# Patient Record
Sex: Male | Born: 1965 | Race: Black or African American | Hispanic: No | Smoking: Current some day smoker
Health system: Southern US, Community
[De-identification: ages and names within clinical notes are randomized; demographics above are authoritative.]

## PROBLEM LIST (undated history)

## (undated) DIAGNOSIS — J449 Chronic obstructive pulmonary disease, unspecified: Secondary | ICD-10-CM

## (undated) DIAGNOSIS — M1990 Unspecified osteoarthritis, unspecified site: Secondary | ICD-10-CM

## (undated) DIAGNOSIS — D171 Benign lipomatous neoplasm of skin and subcutaneous tissue of trunk: Secondary | ICD-10-CM

## (undated) DIAGNOSIS — K429 Umbilical hernia without obstruction or gangrene: Secondary | ICD-10-CM

## (undated) DIAGNOSIS — G4730 Sleep apnea, unspecified: Secondary | ICD-10-CM

## (undated) DIAGNOSIS — K219 Gastro-esophageal reflux disease without esophagitis: Secondary | ICD-10-CM

## (undated) HISTORY — DX: Umbilical hernia without obstruction or gangrene: K42.9

## (undated) HISTORY — DX: Chronic obstructive pulmonary disease, unspecified: J44.9

---

## 1998-05-06 HISTORY — PX: CHEST SURGERY: SHX595

## 2008-08-04 ENCOUNTER — Emergency Department: Payer: Self-pay | Admitting: Emergency Medicine

## 2008-09-12 ENCOUNTER — Emergency Department: Payer: Self-pay | Admitting: Unknown Physician Specialty

## 2008-10-12 ENCOUNTER — Emergency Department: Payer: Self-pay | Admitting: Unknown Physician Specialty

## 2009-03-22 ENCOUNTER — Emergency Department: Payer: Self-pay | Admitting: Emergency Medicine

## 2009-12-18 ENCOUNTER — Emergency Department: Payer: Self-pay | Admitting: Unknown Physician Specialty

## 2010-06-07 ENCOUNTER — Emergency Department: Payer: Self-pay | Admitting: Internal Medicine

## 2013-05-02 ENCOUNTER — Emergency Department: Payer: Self-pay | Admitting: Emergency Medicine

## 2013-05-02 LAB — RAPID INFLUENZA A&B ANTIGENS

## 2013-06-08 ENCOUNTER — Emergency Department: Payer: Self-pay | Admitting: Emergency Medicine

## 2014-12-20 ENCOUNTER — Encounter: Payer: Self-pay | Admitting: Emergency Medicine

## 2014-12-20 DIAGNOSIS — K429 Umbilical hernia without obstruction or gangrene: Secondary | ICD-10-CM | POA: Insufficient documentation

## 2014-12-20 DIAGNOSIS — Z720 Tobacco use: Secondary | ICD-10-CM | POA: Insufficient documentation

## 2014-12-20 LAB — CBC WITH DIFFERENTIAL/PLATELET
BASOS ABS: 0.1 10*3/uL (ref 0–0.1)
Basophils Relative: 1 %
Eosinophils Absolute: 0.3 10*3/uL (ref 0–0.7)
Eosinophils Relative: 4 %
HCT: 44.8 % (ref 40.0–52.0)
Hemoglobin: 15.3 g/dL (ref 13.0–18.0)
LYMPHS PCT: 30 %
Lymphs Abs: 2.4 10*3/uL (ref 1.0–3.6)
MCH: 31 pg (ref 26.0–34.0)
MCHC: 34.2 g/dL (ref 32.0–36.0)
MCV: 90.7 fL (ref 80.0–100.0)
Monocytes Absolute: 0.9 10*3/uL (ref 0.2–1.0)
Monocytes Relative: 11 %
NEUTROS PCT: 54 %
Neutro Abs: 4.4 10*3/uL (ref 1.4–6.5)
Platelets: 197 10*3/uL (ref 150–440)
RBC: 4.94 MIL/uL (ref 4.40–5.90)
RDW: 14 % (ref 11.5–14.5)
WBC: 8.1 10*3/uL (ref 3.8–10.6)

## 2014-12-20 LAB — COMPREHENSIVE METABOLIC PANEL
ALBUMIN: 4.2 g/dL (ref 3.5–5.0)
ALT: 41 U/L (ref 17–63)
AST: 38 U/L (ref 15–41)
Alkaline Phosphatase: 95 U/L (ref 38–126)
Anion gap: 7 (ref 5–15)
BILIRUBIN TOTAL: 0.4 mg/dL (ref 0.3–1.2)
BUN: 18 mg/dL (ref 6–20)
CHLORIDE: 106 mmol/L (ref 101–111)
CO2: 24 mmol/L (ref 22–32)
CREATININE: 1.4 mg/dL — AB (ref 0.61–1.24)
Calcium: 8.8 mg/dL — ABNORMAL LOW (ref 8.9–10.3)
GFR calc Af Amer: >60 mL/min (ref >60)
GFR, EST NON AFRICAN AMERICAN: 58 mL/min — AB (ref >60)
Glucose, Bld: 125 mg/dL — ABNORMAL HIGH (ref 65–99)
Potassium: 3.9 mmol/L (ref 3.5–5.1)
Sodium: 137 mmol/L (ref 135–145)
TOTAL PROTEIN: 7.6 g/dL (ref 6.5–8.1)

## 2014-12-20 LAB — LIPASE, BLOOD: LIPASE: 48 U/L (ref 22–51)

## 2014-12-20 NOTE — ED Notes (Signed)
Pt arrived to the ED for complaints of abdominal pain x2 days with a visible umbilical hernia. Pt states that his last BM was 3 days ago. Pt is AOx4 in moderate pain in triage.

## 2014-12-21 ENCOUNTER — Emergency Department: Payer: Self-pay

## 2014-12-21 ENCOUNTER — Emergency Department
Admission: EM | Admit: 2014-12-21 | Discharge: 2014-12-21 | Disposition: A | Discharge status: Home / Self Care | Payer: Self-pay | Attending: Emergency Medicine | Admitting: Emergency Medicine

## 2014-12-21 DIAGNOSIS — R1033 Periumbilical pain: Secondary | ICD-10-CM

## 2014-12-21 DIAGNOSIS — K429 Umbilical hernia without obstruction or gangrene: Secondary | ICD-10-CM

## 2014-12-21 MED ORDER — IBUPROFEN 800 MG PO TABS: 800.0000 mg | ORAL_TABLET | Freq: Three times a day (TID) | ORAL | Status: DC | PRN

## 2014-12-21 MED ORDER — IOHEXOL 240 MG/ML SOLN
25.0000 mL | Freq: Once | INTRAMUSCULAR | Status: AC | PRN
  Administered 2014-12-21: 25 mL via ORAL

## 2014-12-21 MED ORDER — ACETAMINOPHEN 325 MG PO TABS
650.0000 mg | ORAL_TABLET | Freq: Once | ORAL | Status: AC
  Administered 2014-12-21: 650 mg via ORAL
  Filled 2014-12-21: qty 2

## 2014-12-21 MED ORDER — TRAMADOL HCL 50 MG PO TABS
50.0000 mg | ORAL_TABLET | Freq: Once | ORAL | Status: AC
  Administered 2014-12-21: 50 mg via ORAL
  Filled 2014-12-21: qty 1

## 2014-12-21 MED ORDER — IOHEXOL 300 MG/ML  SOLN
100.0000 mL | Freq: Once | INTRAMUSCULAR | Status: AC | PRN
  Administered 2014-12-21: 100 mL via INTRAVENOUS

## 2014-12-21 MED ORDER — TRAMADOL HCL 50 MG PO TABS: 50.0000 mg | ORAL_TABLET | Freq: Four times a day (QID) | ORAL | Status: DC | PRN

## 2014-12-21 NOTE — Discharge Instructions (Signed)
Abdominal Pain Many things can cause abdominal pain. Usually, abdominal pain is not caused by a disease and will improve without treatment. It can often be observed and treated at home. Your health care provider will do a physical exam and possibly order blood tests and X-rays to help determine the seriousness of your pain. However, in many cases, more time must pass before a clear cause of the pain can be found. Before that point, your health care provider may not know if you need more testing or further treatment. HOME CARE INSTRUCTIONS  Monitor your abdominal pain for any changes. The following actions may help to alleviate any discomfort you are experiencing:  Only take over-the-counter or prescription medicines as directed by your health care provider.  Do not take laxatives unless directed to do so by your health care provider.  Try a clear liquid diet (broth, tea, or water) as directed by your health care provider. Slowly move to a bland diet as tolerated. SEEK MEDICAL CARE IF:  You have unexplained abdominal pain.  You have abdominal pain associated with nausea or diarrhea.  You have pain when you urinate or have a bowel movement.  You experience abdominal pain that wakes you in the night.  You have abdominal pain that is worsened or improved by eating food.  You have abdominal pain that is worsened with eating fatty foods.  You have a fever. SEEK IMMEDIATE MEDICAL CARE IF:   Your pain does not go away within 2 hours.  You keep throwing up (vomiting).  Your pain is felt only in portions of the abdomen, such as the right side or the left lower portion of the abdomen.  You pass bloody or black tarry stools. MAKE SURE YOU:  Understand these instructions.   Will watch your condition.   Will get help right away if you are not doing well or get worse.  Document Released: 01/30/2005 Document Revised: 04/27/2013 Document Reviewed: 12/30/2012 [facility] Patient Information  2015 [place], [place]. This information is not intended to replace advice given to you by your health care provider. Make sure you discuss any questions you have with your health care provider.  Hernia A hernia occurs when an internal organ pushes out through a weak spot in the abdominal wall. Hernias most commonly occur in the groin and around the navel. Hernias often can be pushed back into place (reduced). Most hernias tend to get worse over time. Some abdominal hernias can get stuck in the opening (irreducible or incarcerated hernia) and cannot be reduced. An irreducible abdominal hernia which is tightly squeezed into the opening is at risk for impaired blood supply (strangulated hernia). A strangulated hernia is a medical emergency. Because of the risk for an irreducible or strangulated hernia, surgery may be recommended to repair a hernia. CAUSES   Heavy lifting.  Prolonged coughing.  Straining to have a bowel movement.  A cut (incision) made during an abdominal surgery. HOME CARE INSTRUCTIONS   Bed rest is not required. You may continue your normal activities.  Avoid lifting more than 10 pounds (4.5 kg) or straining.  Cough gently. If you are a smoker it is best to stop. Even the best hernia repair can break down with the continual strain of coughing. Even if you do not have your hernia repaired, a cough will continue to aggravate the problem.  Do not wear anything tight over your hernia. Do not try to keep it in with an outside bandage or truss. These can damage abdominal  contents if they are trapped within the hernia sac.  Eat a normal diet.  Avoid constipation. Straining over long periods of time will increase hernia size and encourage breakdown of repairs. If you cannot do this with diet alone, stool softeners may be used. SEEK IMMEDIATE MEDICAL CARE IF:   You have a fever.  You develop increasing abdominal pain.  You feel nauseous or vomit.  Your hernia is stuck outside  the abdomen, looks discolored, feels hard, or is tender.  You have any changes in your bowel habits or in the hernia that are unusual for you.  You have increased pain or swelling around the hernia.  You cannot push the hernia back in place by applying gentle pressure while lying down. MAKE SURE YOU:   Understand these instructions.  Will watch your condition.  Will get help right away if you are not doing well or get worse. Document Released: 04/22/2005 Document Revised: 07/15/2011 Document Reviewed: 12/10/2007 [facility] Patient Information 2015 [place], [place]. This information is not intended to replace advice given to you by your health care provider. Make sure you discuss any questions you have with your health care provider.

## 2014-12-21 NOTE — ED Provider Notes (Signed)
[facility] Emergency Department Provider Note  ____________________________________________  Time seen: Approximately  157 AM  I have reviewed the triage vital signs and the nursing notes.   HISTORY  Chief Complaint Umbilical Hernia and Abdominal Pain    HPI Barry Raymond is a 49 y.o. male who comes in tonight with abdominal pain. The patient reports that he has an umbilical hernia that he thinks is been there for 6 months and for the last 2 days he has been having some pain around the area. The patient reports he has not been taking anything for the pain he has just been dealing with it. He reports this pain is 8 out of 10 in intensity. The patient does some lifting at work and reports that it bothers him when he lifts things. The patient denies any vomiting or diarrhea. He reports that when he coughs also hernia pokes out. The patient has not had a bowel movement since Saturday. The patient was concerned so he decided to come in for further evaluation.   History reviewed. No pertinent past medical history.  There are no active problems to display for this patient.   History reviewed. No pertinent past surgical history.  Current Outpatient Rx  Name  Route  Sig  Dispense  Refill  . ibuprofen (ADVIL,MOTRIN) 800 MG tablet   Oral   Take 1 tablet (800 mg total) by mouth every 8 (eight) hours as needed.   30 tablet   0   . traMADol (ULTRAM) 50 MG tablet   Oral   Take 1 tablet (50 mg total) by mouth every 6 (six) hours as needed.   12 tablet   0     Allergies Review of patient's allergies indicates no known allergies.  History reviewed. No pertinent family history.  Social History Social History  Substance Use Topics  . Smoking status: Light Tobacco Smoker -- 0.20 packs/day for 20 years    Types: Cigarettes  . Smokeless tobacco: None  . Alcohol Use: Yes    Review of Systems Constitutional: No fever/chills Eyes: No visual changes. ENT: No  sore throat. Cardiovascular: Denies chest pain. Respiratory: Denies shortness of breath. Gastrointestinal:  abdominal pain.  No nausea, no vomiting.  No diarrhea.  No constipation. Genitourinary: Negative for dysuria. Musculoskeletal: Negative for back pain. Skin: Negative for rash. Neurological: Negative for headaches, focal weakness or numbness.  10-point ROS otherwise negative.  ____________________________________________   PHYSICAL EXAM:  VITAL SIGNS: ED Triage Vitals  Enc Vitals Group     BP 12/20/14 2013 113/67 mmHg     Pulse Rate 12/20/14 2013 91     Resp 12/20/14 2013 20     Temp 12/20/14 2013 98.8 F (37.1 C)     Temp Source 12/20/14 2013 Oral     SpO2 12/20/14 2013 93 %     Weight 12/20/14 2013 244 lb (110.678 kg)     Height 12/20/14 2013 5\' 8"  (1.727 m)     Head Cir --      Peak Flow --      Pain Score 12/20/14 2014 9     Pain Loc --      Pain Edu? --      Excl. in [place]? --     Constitutional: Alert and oriented. Well appearing and in mild distress. Eyes: Conjunctivae are normal. PERRL. EOMI. Head: Atraumatic. Nose: No congestion/rhinnorhea. Mouth/Throat: Mucous membranes are moist.  Oropharynx non-erythematous. Cardiovascular: Normal rate, regular rhythm. Grossly normal heart sounds.  Good peripheral  circulation. Respiratory: Normal respiratory effort.  No retractions. Lungs CTAB. Gastrointestinal: Soft with mild perumbilical and umbilical tenderness to palpation. Patient's hernia is reducible.. No distention. Positive bowel sounds Musculoskeletal: No lower extremity tenderness nor edema.  No joint effusions. Neurologic:  Normal speech and language. No gross focal neurologic deficits are appreciated. No gait instability. Skin:  Skin is warm, dry and intact. No rash noted. Psychiatric: Mood and affect are normal.   ____________________________________________   LABS (all labs ordered are listed, but only abnormal results are displayed)  Labs Reviewed   COMPREHENSIVE METABOLIC PANEL - Abnormal; Notable for the following:    Glucose, Bld 125 (*)    Creatinine, Ser 1.40 (*)    Calcium 8.8 (*)    GFR calc non Af Amer 58 (*)    All other components within normal limits  CBC WITH DIFFERENTIAL/PLATELET  LIPASE, BLOOD   ____________________________________________  EKG  None ____________________________________________  RADIOLOGY  CT abdomen and pelvis: Small umbilical hernia with mild edema/congestion of the herniated omentum, 11 mm left adrenal nodule, 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  This is a 49 year old male who comes in today with some umbilical hernia and abdominal pain. The patient's hernia has some edema but no acute incarceration or infection. Patient is not vomiting nor is he having any fever. The patient be discharged home to follow-up with surgery. I did give the patient a dose of Tylenol and tramadol for his pain.  ____________________________________________   FINAL CLINICAL IMPRESSION(S) / ED DIAGNOSES  Final diagnoses:  Umbilical hernia, recurrence not specified  Periumbilical abdominal pain      Loney Hering, MD 12/21/14 [phone]

## 2015-01-03 DIAGNOSIS — K429 Umbilical hernia without obstruction or gangrene: Secondary | ICD-10-CM | POA: Insufficient documentation

## 2015-01-05 ENCOUNTER — Ambulatory Visit: Payer: Self-pay | Admitting: Surgery

## 2015-01-12 ENCOUNTER — Ambulatory Visit: Payer: Self-pay | Admitting: Surgery

## 2015-02-02 ENCOUNTER — Emergency Department
Admission: EM | Admit: 2015-02-02 | Discharge: 2015-02-02 | Disposition: A | Discharge status: Home / Self Care | Payer: Self-pay | Attending: Emergency Medicine | Admitting: Emergency Medicine

## 2015-02-02 DIAGNOSIS — Z720 Tobacco use: Secondary | ICD-10-CM | POA: Insufficient documentation

## 2015-02-02 DIAGNOSIS — K649 Unspecified hemorrhoids: Secondary | ICD-10-CM | POA: Insufficient documentation

## 2015-02-02 DIAGNOSIS — K625 Hemorrhage of anus and rectum: Secondary | ICD-10-CM

## 2015-02-02 LAB — CBC
HEMATOCRIT: 43.4 % (ref 40.0–52.0)
HEMOGLOBIN: 14.6 g/dL (ref 13.0–18.0)
MCH: 30.3 pg (ref 26.0–34.0)
MCHC: 33.7 g/dL (ref 32.0–36.0)
MCV: 89.9 fL (ref 80.0–100.0)
Platelets: 169 10*3/uL (ref 150–440)
RBC: 4.83 MIL/uL (ref 4.40–5.90)
RDW: 13.2 % (ref 11.5–14.5)
WBC: 7.4 10*3/uL (ref 3.8–10.6)

## 2015-02-02 LAB — COMPREHENSIVE METABOLIC PANEL
ALBUMIN: 4 g/dL (ref 3.5–5.0)
ALK PHOS: 81 U/L (ref 38–126)
ALT: 36 U/L (ref 17–63)
ANION GAP: 5 (ref 5–15)
AST: 25 U/L (ref 15–41)
BUN: 17 mg/dL (ref 6–20)
CALCIUM: 8.9 mg/dL (ref 8.9–10.3)
CHLORIDE: 104 mmol/L (ref 101–111)
CO2: 25 mmol/L (ref 22–32)
Creatinine, Ser: 1.27 mg/dL — ABNORMAL HIGH (ref 0.61–1.24)
GFR calc Af Amer: >60 mL/min (ref >60)
GFR calc non Af Amer: >60 mL/min (ref >60)
GLUCOSE: 96 mg/dL (ref 65–99)
Potassium: 3.9 mmol/L (ref 3.5–5.1)
SODIUM: 134 mmol/L — AB (ref 135–145)
Total Bilirubin: 0.5 mg/dL (ref 0.3–1.2)
Total Protein: 7.7 g/dL (ref 6.5–8.1)

## 2015-02-02 MED ORDER — HYDROCORTISONE 2.5 % RE CREA
TOPICAL_CREAM | RECTAL | Status: AC
Start: 2015-02-02 — End: 2016-02-02

## 2015-02-02 NOTE — ED Provider Notes (Signed)
[facility] Emergency Department Provider Note  Time seen: 5:16 PM  I have reviewed the triage vital signs and the nursing notes.   HISTORY  Chief Complaint Rectal Bleeding    HPI Barry Raymond is a 49 y.o. male with no past medical history presents the emergency department rectal bleeding. According to the patient for the past several years she has had intermittent rectal bleeding usually when he strains to have a bowel movement. He states for the past 2 days he has had bleeding even without having a bowel movement or he has to wear toilet paper or a pad to catch blood. Denies any black stool, but does state he does get a hot of blood in the toilet when he has a bowel movement. Denies any other bleeding. Denies hematuria. Denies pain with bowel movements. States he has had intermittent lower abdominal pain but this is ongoing times years as well thought to be due to a hernia.     Past Medical History  Diagnosis Date  . Umbilical hernia     Patient Active Problem List   Diagnosis Date Noted  . Umbilical hernia 62/83/6629    History reviewed. No pertinent past surgical history.  Current Outpatient Rx  Name  Route  Sig  Dispense  Refill  . ibuprofen (ADVIL,MOTRIN) 800 MG tablet   Oral   Take 1 tablet (800 mg total) by mouth every 8 (eight) hours as needed.   30 tablet   0   . traMADol (ULTRAM) 50 MG tablet   Oral   Take 1 tablet (50 mg total) by mouth every 6 (six) hours as needed.   12 tablet   0     Allergies Review of patient's allergies indicates no known allergies.  History reviewed. No pertinent family history.  Social History Social History  Substance Use Topics  . Smoking status: Light Tobacco Smoker -- 0.20 packs/day for 20 years    Types: Cigarettes  . Smokeless tobacco: None  . Alcohol Use: Yes    Review of Systems Constitutional: Negative for fever. Cardiovascular: Negative for chest pain. Respiratory: Negative for  shortness of breath. Gastrointestinal: Negative for abdominal pain. Positive for rectal bleeding. Neurological: Negative for headache 10-point ROS otherwise negative.  ____________________________________________   PHYSICAL EXAM:  VITAL SIGNS: ED Triage Vitals  Enc Vitals Group     BP 02/02/15 1527 127/100 mmHg     Pulse Rate 02/02/15 1527 85     Resp 02/02/15 1527 20     Temp 02/02/15 1527 98.3 F (36.8 C)     Temp Source 02/02/15 1527 Oral     SpO2 02/02/15 1527 95 %     Weight 02/02/15 1527 230 lb (104.327 kg)     Height 02/02/15 1527 5\' 8"  (1.727 m)     Head Cir --      Peak Flow --      Pain Score 02/02/15 1645 0     Pain Loc --      Pain Edu? --      Excl. in [place]? --     Constitutional: Alert and oriented. Well appearing and in no distress. Eyes: Normal exam ENT   Mouth/Throat: Mucous membranes are moist. Cardiovascular: Normal rate, regular rhythm. Respiratory: Normal respiratory effort without tachypnea nor retractions. Breath sounds are clear and equal bilaterally. No wheezes/rales/rhonchi. Gastrointestinal: Soft and nontender. No distention. Rectal exam shows dark brown stool, guaiac positive. Large external hemorrhoids , Decompressed, not thrombosed. Musculoskeletal: Nontender with normal range  of motion in all extremities. Neurologic:  Normal speech and language. No gross focal neurologic deficits  Skin:  Skin is warm, dry and intact.  Psychiatric: Mood and affect are normal. Speech and behavior are normal. Patient exhibits appropriate insight and judgment.  ____________________________________________   INITIAL IMPRESSION / ASSESSMENT AND PLAN / ED COURSE  Pertinent labs & imaging results that were available during my care of the patient were reviewed by me and considered in my medical decision making (see chart for details).  Patient with rectal bleeding 2 days, intermittent times years. Large decompressed hemorrhoids on exam. Dark brown stool which  is guaiac positive. Labs are largely within normal limits. Patient has noted increased tenderness to his hemorrhoids recently. I discussed with the patient the need to follow up with GI medicine for a colonoscopy, patient is agreeable to plan and will call the number provided. We will discharge on Anusol, for discomfort relief of his hemorrhoids. Discussed return precautions, patient agreeable.  ____________________________________________   FINAL CLINICAL IMPRESSION(S) / ED DIAGNOSES  rectal bleeding   Harvest Dark, MD 02/02/15 1719

## 2015-02-02 NOTE — Discharge Instructions (Signed)
As we discussed please use her prescribed cream as written. Please call Dr. Pecola Leisure to arrange a follow-up appointment as soon as possible for further evaluation and colonoscopy consideration. Return to the emergency department for any increased bleeding, lightheadedness, dizziness, or any other symptom personally concerning to yourself.   Bloody Stools Bloody stools often mean that there is a problem in the digestive tract. Your caregiver may use the term "melena" to describe black, tarry, and bad smelling stools or "hematochezia" to describe red or maroon-colored stools. Blood seen in the stool can be caused by bleeding anywhere along the intestinal tract.  A black stool usually means that blood is coming from the upper part of the gastrointestinal tract (esophagus, stomach, or small bowel). Passing maroon-colored stools or bright red blood usually means that blood is coming from lower down in the large bowel or the rectum. However, sometimes massive bleeding in the stomach or small intestine can cause bright red bloody stools.  Consuming black licorice, lead, iron pills, medicines containing bismuth subsalicylate, or blueberries can also cause black stools. Your caregiver can test black stools to see if blood is present. It is important that the cause of the bleeding be found. Treatment can then be started, and the problem can be corrected. Rectal bleeding may not be serious, but you should not assume everything is okay until you know the cause.It is very important to follow up with your caregiver or a specialist in gastrointestinal problems. CAUSES  Blood in the stools can come from various underlying causes.Often, the cause is not found during your first visit. Testing is often needed to discover the cause of bleeding in the gastrointestinal tract. Causes range from simple to serious or even life-threatening.Possible causes include:  Hemorrhoids.These are veins that are full of blood (engorged) in the  rectum. They cause pain, inflammation, and may bleed.  Anal fissures.These are areas of painful tearing which may bleed. They are often caused by passing hard stool.  Diverticulosis.These are pouches that form on the colon over time, with age, and may bleed significantly.  Diverticulitis.This is inflammation in areas with diverticulosis. It can cause pain, fever, and bloody stools, although bleeding is rare.  Proctitis and colitis. These are inflamed areas of the rectum or colon. They may cause pain, fever, and bloody stools.  Polyps and cancer. Colon cancer is a leading cause of preventable cancer death.It often starts out as precancerous polyps that can be removed during a colonoscopy, preventing progression into cancer. Sometimes, polyps and cancer may cause rectal bleeding.  Gastritis and ulcers.Bleeding from the upper gastrointestinal tract (near the stomach) may travel through the intestines and produce black, sometimes tarry, often bad smelling stools. In certain cases, if the bleeding is fast enough, the stools may not be black, but red and the condition may be life-threatening. SYMPTOMS  You may have stools that are bright red and bloody, that are normal color with blood on them, or that are dark black and tarry. In some cases, you may only have blood in the toilet bowl. Any of these cases need medical care. You may also have:  Pain at the anus or anywhere in the rectum.  Lightheadedness or feeling faint.  Extreme weakness.  Nausea or vomiting.  Fever. DIAGNOSIS Your caregiver may use the following methods to find the cause of your bleeding:  Taking a medical history. Age is important. Older people tend to develop polyps and cancer more often. If there is anal pain and a hard, large stool  associated with bleeding, a tear of the anus may be the cause. If blood drips into the toilet after a bowel movement, bleeding hemorrhoids may be the problem. The color and frequency of the  bleeding are additional considerations. In most cases, the medical history provides clues, but seldom the final answer.  A visual and finger (digital) exam. Your caregiver will inspect the anal area, looking for tears and hemorrhoids. A finger exam can provide information when there is tenderness or a growth inside. In men, the prostate is also examined.  Endoscopy. Several types of small, long scopes (endoscopes) are used to view the colon.  In the office, your caregiver may use a rigid, or more commonly, a flexible viewing sigmoidoscope. This exam is called flexible sigmoidoscopy. It is performed in 5 to 10 minutes.  A more thorough exam is accomplished with a colonoscope. It allows your caregiver to view the entire 5 to 6 foot long colon. Medicine to help you relax (sedative) is usually given for this exam. Frequently, a bleeding lesion may be present beyond the reach of the sigmoidoscope. So, a colonoscopy may be the best exam to start with. Both exams are usually done on an outpatient basis. This means the patient does not stay overnight in the hospital or surgery center.  An upper endoscopy may be needed to examine your stomach. Sedation is used and a flexible endoscope is put in your mouth, down to your stomach.  A barium enema X-ray. This is an X-ray exam. It uses liquid barium inserted by enema into the rectum. This test alone may not identify an actual bleeding point. X-rays highlight abnormal shadows, such as those made by lumps (tumors), diverticuli, or colitis. TREATMENT  Treatment depends on the cause of your bleeding.   For bleeding from the stomach or colon, the caregiver doing your endoscopy or colonoscopy may be able to stop the bleeding as part of the procedure.  Inflammation or infection of the colon can be treated with medicines.  Many rectal problems can be treated with creams, suppositories, or warm baths.  Surgery is sometimes needed.  Blood transfusions are sometimes  needed if you have lost a lot of blood.  For any bleeding problem, let your caregiver know if you take aspirin or other blood thinners regularly. HOME CARE INSTRUCTIONS   Take any medicines exactly as prescribed.  Keep your stools soft by eating a diet high in fiber. Prunes (1 to 3 a day) work well for many people.  Drink enough water and fluids to keep your urine clear or pale yellow.  Take sitz baths if advised. A sitz bath is when you sit in a bathtub with warm water for 10 to 15 minutes to soak, soothe, and cleanse the rectal area.  If enemas or suppositories are advised, be sure you know how to use them. Tell your caregiver if you have problems with this.  Monitor your bowel movements to look for signs of improvement or worsening. SEEK MEDICAL CARE IF:   You do not improve in the time expected.  Your condition worsens after initial improvement.  You develop any new symptoms. SEEK IMMEDIATE MEDICAL CARE IF:   You develop severe or prolonged rectal bleeding.  You vomit blood.  You feel weak or faint.  You have a fever. MAKE SURE YOU:  Understand these instructions.  Will watch your condition.  Will get help right away if you are not doing well or get worse. Document Released: 04/12/2002 Document Revised: 07/15/2011 Document  Reviewed: 09/07/2010 [employer] Patient Information 2015 [place], [place]. This information is not intended to replace advice given to you by your health care provider. Make sure you discuss any questions you have with your health care provider.  Hemorrhoids Hemorrhoids are puffy (swollen) veins around the rectum or anus. Hemorrhoids can cause pain, itching, bleeding, or irritation. HOME CARE  Eat foods with fiber, such as whole grains, beans, nuts, fruits, and vegetables. Ask your doctor about taking products with added fiber in them (fibersupplements).  Drink enough fluid to keep your pee (urine) clear or pale yellow.  Exercise often.  Go to  the bathroom when you have the urge to poop. Do not wait.  Avoid straining to poop (bowel movement).  Keep the butt area dry and clean. Use wet toilet paper or moist paper towels.  Medicated creams and medicine inserted into the anus (anal suppository) may be used or applied as told.  Only take medicine as told by your doctor.  Take a warm water bath (sitz bath) for 15-20 minutes to ease pain. Do this 3-4 times a day.  Place ice packs on the area if it is tender or puffy. Use the ice packs between the warm water baths.  Put ice in a plastic bag.  Place a towel between your skin and the bag.  Leave the ice on for 15-20 minutes, 03-04 times a day.  Do not use a donut-shaped pillow or sit on the toilet for a long time. GET HELP RIGHT AWAY IF:   You have more pain that is not controlled by treatment or medicine.  You have bleeding that will not stop.  You have trouble or are unable to poop (bowel movement).  You have pain or puffiness outside the area of the hemorrhoids. MAKE SURE YOU:   Understand these instructions.  Will watch your condition.  Will get help right away if you are not doing well or get worse. Document Released: 01/30/2008 Document Revised: 04/08/2012 Document Reviewed: 03/03/2012 [facility] Patient Information 2015 [place], [place]. This information is not intended to replace advice given to you by your health care provider. Make sure you discuss any questions you have with your health care provider.

## 2015-02-02 NOTE — ED Notes (Signed)
Patient verbalized understanding of importance of following up with GI doctor for possible colonoscopy and evaluation.

## 2015-02-02 NOTE — ED Notes (Signed)
Patient with rectal bleeding x 2 days.  Patient stated "it just constantly leaks and I have to keep my underwear changed". Patient denies bleeding with bowel movement or with wiping.

## 2015-03-10 ENCOUNTER — Encounter: Payer: Self-pay | Admitting: Emergency Medicine

## 2015-03-10 ENCOUNTER — Emergency Department: Payer: Self-pay

## 2015-03-10 ENCOUNTER — Emergency Department
Admission: EM | Admit: 2015-03-10 | Discharge: 2015-03-10 | Disposition: A | Discharge status: Home / Self Care | Payer: Self-pay | Attending: Emergency Medicine | Admitting: Emergency Medicine

## 2015-03-10 DIAGNOSIS — R0602 Shortness of breath: Secondary | ICD-10-CM | POA: Insufficient documentation

## 2015-03-10 DIAGNOSIS — Z7952 Long term (current) use of systemic steroids: Secondary | ICD-10-CM | POA: Insufficient documentation

## 2015-03-10 DIAGNOSIS — Z720 Tobacco use: Secondary | ICD-10-CM | POA: Insufficient documentation

## 2015-03-10 DIAGNOSIS — K2970 Gastritis, unspecified, without bleeding: Secondary | ICD-10-CM | POA: Insufficient documentation

## 2015-03-10 LAB — BASIC METABOLIC PANEL
Anion gap: 7 (ref 5–15)
BUN: 15 mg/dL (ref 6–20)
CO2: 24 mmol/L (ref 22–32)
CREATININE: 1.18 mg/dL (ref 0.61–1.24)
Calcium: 9.2 mg/dL (ref 8.9–10.3)
Chloride: 108 mmol/L (ref 101–111)
GFR calc Af Amer: >60 mL/min (ref >60)
GLUCOSE: 101 mg/dL — AB (ref 65–99)
POTASSIUM: 4.1 mmol/L (ref 3.5–5.1)
SODIUM: 139 mmol/L (ref 135–145)

## 2015-03-10 LAB — CBC
HEMATOCRIT: 42.9 % (ref 40.0–52.0)
Hemoglobin: 14.7 g/dL (ref 13.0–18.0)
MCH: 30.6 pg (ref 26.0–34.0)
MCHC: 34.3 g/dL (ref 32.0–36.0)
MCV: 89.2 fL (ref 80.0–100.0)
Platelets: 192 10*3/uL (ref 150–440)
RBC: 4.82 MIL/uL (ref 4.40–5.90)
RDW: 13.9 % (ref 11.5–14.5)
WBC: 7.8 10*3/uL (ref 3.8–10.6)

## 2015-03-10 LAB — BRAIN NATRIURETIC PEPTIDE: B Natriuretic Peptide: 9 pg/mL (ref 0.0–100.0)

## 2015-03-10 LAB — TROPONIN I: Troponin I: <0.03 ng/mL (ref <0.031)

## 2015-03-10 MED ORDER — GI COCKTAIL ~~LOC~~
30.0000 mL | Freq: Once | ORAL | Status: AC
  Administered 2015-03-10: 30 mL via ORAL
  Filled 2015-03-10: qty 30

## 2015-03-10 MED ORDER — PANTOPRAZOLE SODIUM 40 MG PO TBEC: 40.0000 mg | DELAYED_RELEASE_TABLET | Freq: Every day | ORAL | Status: DC

## 2015-03-10 MED ORDER — SUCRALFATE 1 G PO TABS: 1.0000 g | ORAL_TABLET | Freq: Four times a day (QID) | ORAL | Status: DC

## 2015-03-10 NOTE — Discharge Instructions (Signed)
Please seek medical attention for any high fevers, chest pain, shortness of breath, change in behavior, persistent vomiting, bloody stool or any other new or concerning symptoms.   Gastritis, Adult Gastritis is soreness and swelling (inflammation) of the lining of the stomach. Gastritis can develop as a sudden onset (acute) or long-term (chronic) condition. If gastritis is not treated, it can lead to stomach bleeding and ulcers. CAUSES  Gastritis occurs when the stomach lining is weak or damaged. Digestive juices from the stomach then inflame the weakened stomach lining. The stomach lining may be weak or damaged due to viral or bacterial infections. One common bacterial infection is the Helicobacter pylori infection. Gastritis can also result from excessive alcohol consumption, taking certain medicines, or having too much acid in the stomach.  SYMPTOMS  In some cases, there are no symptoms. When symptoms are present, they may include:  Pain or a burning sensation in the upper abdomen.  Nausea.  Vomiting.  An uncomfortable feeling of fullness after eating. DIAGNOSIS  Your caregiver may suspect you have gastritis based on your symptoms and a physical exam. To determine the cause of your gastritis, your caregiver may perform the following:  Blood or stool tests to check for the H pylori bacterium.  Gastroscopy. A thin, flexible tube (endoscope) is passed down the esophagus and into the stomach. The endoscope has a light and camera on the end. Your caregiver uses the endoscope to view the inside of the stomach.  Taking a tissue sample (biopsy) from the stomach to examine under a microscope. TREATMENT  Depending on the cause of your gastritis, medicines may be prescribed. If you have a bacterial infection, such as an H pylori infection, antibiotics may be given. If your gastritis is caused by too much acid in the stomach, H2 blockers or antacids may be given. Your caregiver may recommend that  you stop taking aspirin, ibuprofen, or other nonsteroidal anti-inflammatory drugs (NSAIDs). HOME CARE INSTRUCTIONS  Only take over-the-counter or prescription medicines as directed by your caregiver.  If you were given antibiotic medicines, take them as directed. Finish them even if you start to feel better.  Drink enough fluids to keep your urine clear or pale yellow.  Avoid foods and drinks that make your symptoms worse, such as:  Caffeine or alcoholic drinks.  Chocolate.  Peppermint or mint flavorings.  Garlic and onions.  Spicy foods.  Citrus fruits, such as oranges, lemons, or limes.  Tomato-based foods such as sauce, chili, salsa, and pizza.  Fried and fatty foods.  Eat small, frequent meals instead of large meals. SEEK IMMEDIATE MEDICAL CARE IF:   You have black or dark red stools.  You vomit blood or material that looks like coffee grounds.  You are unable to keep fluids down.  Your abdominal pain gets worse.  You have a fever.  You do not feel better after 1 week.  You have any other questions or concerns. MAKE SURE YOU:  Understand these instructions.  Will watch your condition.  Will get help right away if you are not doing well or get worse.   This information is not intended to replace advice given to you by your health care provider. Make sure you discuss any questions you have with your health care provider.   Document Released: 04/16/2001 Document Revised: 10/22/2011 Document Reviewed: 06/05/2011 Elsevier Interactive Patient Education [employer].

## 2015-03-10 NOTE — ED Provider Notes (Signed)
[facility] Emergency Department Provider Note    ____________________________________________  Time seen: 1400  I have reviewed the triage vital signs and the nursing notes.   HISTORY  Chief Complaint Shortness of Breath and Chest Pain   History limited by: Not Limited   HPI Barry Raymond is a 49 y.o. male who presents to the emergency department today after an episode of chest pain. He states he was getting ready in the morning when it started. He described it as sharp. It was located in the left upper chest. It did not radiate. He did have some associated shortness of breath. He did not have any diaphoresis. He stated that the pain then started getting better. He denies any recent illnesses. Denies any trauma to his chest. Denies any fevers  Past Medical History  Diagnosis Date  . Umbilical hernia     Patient Active Problem List   Diagnosis Date Noted  . Umbilical hernia [id number]/7739    History reviewed. No pertinent past surgical history.  Current Outpatient Rx  Name  Route  Sig  Dispense  Refill  . hydrocortisone (ANUSOL-HC) 2.5 % rectal cream      Apply rectally 2 times daily   30 g   1   . ibuprofen (ADVIL,MOTRIN) 800 MG tablet   Oral   Take 1 tablet (800 mg total) by mouth every 8 (eight) hours as needed.   30 tablet   0   . traMADol (ULTRAM) 50 MG tablet   Oral   Take 1 tablet (50 mg total) by mouth every 6 (six) hours as needed.   12 tablet   0     Allergies Review of patient's allergies indicates no known allergies.  No family history on file.  Social History Social History  Substance Use Topics  . Smoking status: Light Tobacco Smoker -- 0.20 packs/day for 20 years    Types: Cigarettes  . Smokeless tobacco: None  . Alcohol Use: Yes    Review of Systems  Constitutional: Negative for fever. Cardiovascular: Positive for chest pain. Respiratory: Positive for shortness of breath. Gastrointestinal: Negative for  abdominal pain, vomiting and diarrhea. Genitourinary: Negative for dysuria. Musculoskeletal: Negative for back pain. Skin: Negative for rash. Neurological: Negative for headaches, focal weakness or numbness.   10-point ROS otherwise negative.  ____________________________________________   PHYSICAL EXAM:  VITAL SIGNS: ED Triage Vitals  Enc Vitals Group     BP 03/10/15 1029 126/79 mmHg     Pulse Rate 03/10/15 1029 78     Resp 03/10/15 1029 18     Temp --      Temp src --      SpO2 03/10/15 1029 96 %     Weight 03/10/15 1029 230 lb (104.327 kg)     Height 03/10/15 1029 5\' 8"  (1.727 m)     Head Cir --      Peak Flow --      Pain Score 03/10/15 1028 6   Constitutional: Alert and oriented. Well appearing and in no distress. Eyes: Conjunctivae are normal. PERRL. Normal extraocular movements. ENT   Head: Normocephalic and atraumatic.   Nose: No congestion/rhinnorhea.   Mouth/Throat: Mucous membranes are moist.   Neck: No stridor. Hematological/Lymphatic/Immunilogical: No cervical lymphadenopathy. Cardiovascular: Normal rate, regular rhythm.  No murmurs, rubs, or gallops. Respiratory: Normal respiratory effort without tachypnea nor retractions. Breath sounds are clear and equal bilaterally. No wheezes/rales/rhonchi. Gastrointestinal: Soft and nontender. No distention.  Genitourinary: Deferred Musculoskeletal: Normal range of motion in  all extremities. No joint effusions.  No lower extremity tenderness nor edema. Neurologic:  Normal speech and language. No gross focal neurologic deficits are appreciated.  Skin:  Skin is warm, dry and intact. No rash noted. Psychiatric: Mood and affect are normal. Speech and behavior are normal. Patient exhibits appropriate insight and judgment.  ____________________________________________    LABS (pertinent positives/negatives)  Labs Reviewed  BASIC METABOLIC PANEL - Abnormal; Notable for the following:    Glucose, Bld 101 (*)     All other components within normal limits  TROPONIN I  CBC  BRAIN NATRIURETIC PEPTIDE     ____________________________________________   EKG  I, Nance Pear, attending physician, personally viewed and interpreted this EKG  EKG Time: 1021 Rate: 81 Rhythm: NSR Axis: normal Intervals: qtc 399 QRS: narrow ST changes: no st elevation Impression: normal ekg ____________________________________________    RADIOLOGY  CXR  IMPRESSION: Slight atelectasis left base. Lungs otherwise clear.  Soft tissue mass posterior to the thoracolumbar junction, consistent with lipoma as seen on recent CT examination.   ____________________________________________   PROCEDURES  Procedure(s) performed: None  Critical Care performed: No  ____________________________________________   INITIAL IMPRESSION / ASSESSMENT AND PLAN / ED COURSE  Pertinent labs & imaging results that were available during my care of the patient were reviewed by me and considered in my medical decision making (see chart for details).  Patient presented after episode of left chest pain. Blood work, Copy and cxr without concerning findings. Patient did feel better after GI cocktail, has had lots of stress. Will plan on discharging home with antacid and sucralfate.  ____________________________________________   FINAL CLINICAL IMPRESSION(S) / ED DIAGNOSES  Final diagnoses:  Gastritis     Nance Pear, MD 03/10/15 1504

## 2015-03-10 NOTE — ED Notes (Signed)
In lobby awaiting room.  Pt in nad.  Has blanket on.

## 2015-03-10 NOTE — ED Notes (Signed)
Pt presents with shortness of breath and left sided chest pain started this am while he was at work. Pt is a smoker.

## 2015-11-12 ENCOUNTER — Encounter: Payer: Self-pay | Admitting: Emergency Medicine

## 2015-11-12 ENCOUNTER — Emergency Department: Payer: Self-pay

## 2015-11-12 ENCOUNTER — Emergency Department
Admission: EM | Admit: 2015-11-12 | Discharge: 2015-11-13 | Disposition: A | Discharge status: Home / Self Care | Payer: Self-pay | Attending: Emergency Medicine | Admitting: Emergency Medicine

## 2015-11-12 DIAGNOSIS — F17210 Nicotine dependence, cigarettes, uncomplicated: Secondary | ICD-10-CM | POA: Insufficient documentation

## 2015-11-12 DIAGNOSIS — M6281 Muscle weakness (generalized): Secondary | ICD-10-CM | POA: Insufficient documentation

## 2015-11-12 DIAGNOSIS — J029 Acute pharyngitis, unspecified: Secondary | ICD-10-CM | POA: Insufficient documentation

## 2015-11-12 DIAGNOSIS — R51 Headache: Secondary | ICD-10-CM | POA: Insufficient documentation

## 2015-11-12 DIAGNOSIS — R531 Weakness: Secondary | ICD-10-CM

## 2015-11-12 DIAGNOSIS — R519 Headache, unspecified: Secondary | ICD-10-CM

## 2015-11-12 DIAGNOSIS — Z79899 Other long term (current) drug therapy: Secondary | ICD-10-CM | POA: Insufficient documentation

## 2015-11-12 HISTORY — DX: Benign lipomatous neoplasm of skin and subcutaneous tissue of trunk: D17.1

## 2015-11-12 LAB — BASIC METABOLIC PANEL
Anion gap: 6 (ref 5–15)
BUN: 17 mg/dL (ref 6–20)
CALCIUM: 9.6 mg/dL (ref 8.9–10.3)
CO2: 27 mmol/L (ref 22–32)
CREATININE: 1.25 mg/dL — AB (ref 0.61–1.24)
Chloride: 103 mmol/L (ref 101–111)
GFR calc non Af Amer: >60 mL/min (ref >60)
Glucose, Bld: 97 mg/dL (ref 65–99)
Potassium: 3.9 mmol/L (ref 3.5–5.1)
Sodium: 136 mmol/L (ref 135–145)

## 2015-11-12 LAB — CBC
HCT: 43.9 % (ref 40.0–52.0)
Hemoglobin: 15.1 g/dL (ref 13.0–18.0)
MCH: 30.7 pg (ref 26.0–34.0)
MCHC: 34.3 g/dL (ref 32.0–36.0)
MCV: 89.6 fL (ref 80.0–100.0)
Platelets: 147 10*3/uL — ABNORMAL LOW (ref 150–440)
RBC: 4.9 MIL/uL (ref 4.40–5.90)
RDW: 14.2 % (ref 11.5–14.5)
WBC: 11.3 10*3/uL — ABNORMAL HIGH (ref 3.8–10.6)

## 2015-11-12 LAB — TROPONIN I: Troponin I: <0.03 ng/mL (ref <0.03)

## 2015-11-12 LAB — MONONUCLEOSIS SCREEN: MONO SCREEN: NEGATIVE

## 2015-11-12 LAB — POCT RAPID STREP A: STREPTOCOCCUS, GROUP A SCREEN (DIRECT): NEGATIVE

## 2015-11-12 MED ORDER — KETOROLAC TROMETHAMINE 30 MG/ML IJ SOLN
30.0000 mg | Freq: Once | INTRAMUSCULAR | Status: AC
  Administered 2015-11-12: 30 mg via INTRAVENOUS
  Filled 2015-11-12: qty 1

## 2015-11-12 MED ORDER — DIPHENHYDRAMINE HCL 50 MG/ML IJ SOLN
25.0000 mg | Freq: Once | INTRAMUSCULAR | Status: AC
  Administered 2015-11-12: 25 mg via INTRAVENOUS
  Filled 2015-11-12: qty 1

## 2015-11-12 MED ORDER — METOCLOPRAMIDE HCL 5 MG/ML IJ SOLN
10.0000 mg | Freq: Once | INTRAMUSCULAR | Status: AC
  Administered 2015-11-12: 10 mg via INTRAVENOUS
  Filled 2015-11-12: qty 2

## 2015-11-12 MED ORDER — SODIUM CHLORIDE 0.9 % IV BOLUS (SEPSIS)
1000.0000 mL | Freq: Once | INTRAVENOUS | Status: AC
  Administered 2015-11-12: 1000 mL via INTRAVENOUS

## 2015-11-12 NOTE — ED Notes (Signed)
Patient transported to CT with CT tech via stretcher. °

## 2015-11-12 NOTE — ED Provider Notes (Signed)
[facility] Emergency Department Provider Note   ____________________________________________  Time seen: Approximately 11:08 PM  I have reviewed the triage vital signs and the nursing notes.   HISTORY  Chief Complaint Palpitations; Weakness; Sore Throat; and Headache    HPI Barry Raymond is a 50 y.o. male who comes into the hospital today reporting that he's not sure what going on. He reports he woke up this morning with a sore throat and headache and feeling weak. He reports he has no strength. He feels that the left side of his head as thumping behind his eye. He took 2 Tylenol while at work but reports it didn't help. He reports that he did have a sharp pain in his chest and coughed up some blood this morning. The patient reports that he does have a fever but he is not sure how high. He reports it was 102 when he arrived here. He reports that he is unsure if maybe his grandkids are sick. He reports he's never felt this way before. He has some mild shortness of breath but denies any abdominal pain nausea or vomiting. He has never had problems with headaches in the past and he denies any neck pain. He has some mild neck stiffness. He rates his pain a 7 out of 10 in intensity. He came in here for evaluation.   Past Medical History  Diagnosis Date  . Umbilical hernia   . Lipoma of back     Noted on xray today    Patient Active Problem List   Diagnosis Date Noted  . Umbilical hernia [id number]    History reviewed. No pertinent past surgical history.  Current Outpatient Rx  Name  Route  Sig  Dispense  Refill  . hydrocortisone (ANUSOL-HC) 2.5 % rectal cream      Apply rectally 2 times daily   30 g   1   . ibuprofen (ADVIL,MOTRIN) 800 MG tablet   Oral   Take 1 tablet (800 mg total) by mouth every 8 (eight) hours as needed.   30 tablet   0   . pantoprazole (PROTONIX) 40 MG tablet   Oral   Take 1 tablet (40 mg total) by mouth daily.   30  tablet   1   . sucralfate (CARAFATE) 1 G tablet   Oral   Take 1 tablet (1 g total) by mouth 4 (four) times daily.   60 tablet   0   . traMADol (ULTRAM) 50 MG tablet   Oral   Take 1 tablet (50 mg total) by mouth every 6 (six) hours as needed.   12 tablet   0     Allergies Review of patient's allergies indicates no known allergies.  History reviewed. No pertinent family history.  Social History Social History  Substance Use Topics  . Smoking status: Light Tobacco Smoker -- 0.20 packs/day for 20 years    Types: Cigarettes  . Smokeless tobacco: None  . Alcohol Use: Yes    Review of Systems Constitutional:  fever/chills Eyes: No visual changes. ENT: No sore throat. Cardiovascular: chest pain. Respiratory: shortness of breath. Gastrointestinal: No abdominal pain.  No nausea, no vomiting.  No diarrhea.  No constipation. Genitourinary: Negative for dysuria. Musculoskeletal: Negative for back pain. Skin: Negative for rash. Neurological: Headache and generalized weakness  10-point ROS otherwise negative.  ____________________________________________   PHYSICAL EXAM:  VITAL SIGNS: ED Triage Vitals  Enc Vitals Group     BP 11/12/15 2128 117/86 mmHg  Pulse Rate 11/12/15 2128 93     Resp 11/12/15 2128 18     Temp 11/12/15 2128 100.2 F (37.9 C)     Temp Source 11/12/15 2128 Oral     SpO2 11/12/15 2128 96 %     Weight 11/12/15 2128 240 lb (108.863 kg)     Height 11/12/15 2128 5\' 8"  (1.727 m)     Head Cir --      Peak Flow --      Pain Score 11/12/15 2128 7     Pain Loc --      Pain Edu? --      Excl. in [place]? --     Constitutional: Alert and oriented. Well appearing and in Mild distress. Eyes: Conjunctivae are normal. PERRL. EOMI. Head: Atraumatic. Nose: No congestion/rhinnorhea. Mouth/Throat: Mucous membranes are moist.  Oropharynx mildly. Neck: Neck is supple with no meningismus Hematological/Lymphatic/Immunilogical:  cervical  lymphadenopathy. Cardiovascular: Normal rate, regular rhythm. Grossly normal heart sounds.  Good peripheral circulation. Respiratory: Normal respiratory effort.  No retractions. Lungs CTAB. Gastrointestinal: Soft and nontender. No distention. Positive bowel sounds Musculoskeletal: No lower extremity tenderness nor edema.   Neurologic:  Normal speech and language.  Skin:  Skin is warm, dry and intact.Marland Kitchen Psychiatric: Mood and affect are normal.   ____________________________________________   LABS (all labs ordered are listed, but only abnormal results are displayed)  Labs Reviewed  BASIC METABOLIC PANEL - Abnormal; Notable for the following:    Creatinine, Ser 1.25 (*)    All other components within normal limits  CBC - Abnormal; Notable for the following:    WBC 11.3 (*)    Platelets 147 (*)    All other components within normal limits  CULTURE, GROUP A STREP [facility])  TROPONIN I  MONONUCLEOSIS SCREEN  POCT RAPID STREP A   ____________________________________________  EKG  ED ECG REPORT I, Loney Hering, the attending physician, personally viewed and interpreted this ECG.   Date: 11/12/2015  EKG Time: 2132  Rate: 91  Rhythm: normal sinus rhythm  Axis: normal  Intervals:none  ST&T Change: none  ____________________________________________  RADIOLOGY  Chest x-ray: No active cardiopulmonary disease  CT head: No acute intracranial abnormalities ____________________________________________   PROCEDURES  Procedure(s) performed: None  Procedures  Critical Care performed: No  ____________________________________________   INITIAL IMPRESSION / ASSESSMENT AND PLAN / ED COURSE  Pertinent labs & imaging results that were available during my care of the patient were reviewed by me and considered in my medical decision making (see chart for details).  This is a 50 year old male who comes into the hospital today not feeling well. I will check the patient for  mono given that he does have sore throat with some lymphadenopathy and unwell feeling. The patient will be reassessed. He will also receive some Reglan, Benadryl, Toradol and a liter of normal saline.  The patient feels improved and will be discharged to home. I feel that the patient has a virus that may be causing his symptoms. His headache is improved and all of his other results are unremarkable. The patient will follow up with the acute care clinic. ____________________________________________   FINAL CLINICAL IMPRESSION(S) / ED DIAGNOSES  Final diagnoses:  Generalized weakness  Acute nonintractable headache, unspecified headache type  Sore throat      NEW MEDICATIONS STARTED DURING THIS VISIT:  Discharge Medication List as of 11/13/2015  2:32 AM       Note:  This document was prepared using Dragon voice recognition software  and may include unintentional dictation errors.    Loney Hering, MD 11/13/15 [phone]

## 2015-11-12 NOTE — ED Notes (Signed)
Pt says he's just coming in from working a 12 hour shift; c/o general weakness, headache above and behind left eye, sore throat and intermittent sharp pains to left chest; pain is non radiating; denies shortness of breath; some nausea; no vomiting;

## 2015-11-12 NOTE — ED Notes (Addendum)
Pt c/o of weakness, headache, sore throat, left chest pain beginning at 8 am this morning.

## 2015-11-13 NOTE — Discharge Instructions (Signed)
General Headache Without Cause A headache is pain or discomfort felt around the head or neck area. The specific cause of a headache may not be found. There are many causes and types of headaches. A few common ones are:  Tension headaches.  Migraine headaches.  Cluster headaches.  Chronic daily headaches. HOME CARE INSTRUCTIONS  Watch your condition for any changes. Take these steps to help with your condition: Managing Pain  Take over-the-counter and prescription medicines only as told by your health care provider.  Lie down in a dark, quiet room when you have a headache.  If directed, apply ice to the head and neck area:  Put ice in a plastic bag.  Place a towel between your skin and the bag.  Leave the ice on for 20 minutes, 2-3 times per day.  Use a heating pad or hot shower to apply heat to the head and neck area as told by your health care provider.  Keep lights dim if bright lights bother you or make your headaches worse. Eating and Drinking  Eat meals on a regular schedule.  Limit alcohol use.  Decrease the amount of caffeine you drink, or stop drinking caffeine. General Instructions  Keep all follow-up visits as told by your health care provider. This is important.  Keep a headache journal to help find out what may trigger your headaches. For example, write down:  What you eat and drink.  How much sleep you get.  Any change to your diet or medicines.  Try massage or other relaxation techniques.  Limit stress.  Sit up straight, and do not tense your muscles.  Do not use tobacco products, including cigarettes, chewing tobacco, or e-cigarettes. If you need help quitting, ask your health care provider.  Exercise regularly as told by your health care provider.  Sleep on a regular schedule. Get 7-9 hours of sleep, or the amount recommended by your health care provider. SEEK MEDICAL CARE IF:   Your symptoms are not helped by medicine.  You have a  headache that is different from the usual headache.  You have nausea or you vomit.  You have a fever. SEEK IMMEDIATE MEDICAL CARE IF:   Your headache becomes severe.  You have repeated vomiting.  You have a stiff neck.  You have a loss of vision.  You have problems with speech.  You have pain in the eye or ear.  You have muscular weakness or loss of muscle control.  You lose your balance or have trouble walking.  You feel faint or pass out.  You have confusion.   This information is not intended to replace advice given to you by your health care provider. Make sure you discuss any questions you have with your health care provider.   Document Released: 04/22/2005 Document Revised: 01/11/2015 Document Reviewed: 08/15/2014 Elsevier Interactive Patient Education 2016 Elsevier Inc.  Pharyngitis Pharyngitis is redness, pain, and swelling (inflammation) of your pharynx.  CAUSES  Pharyngitis is usually caused by infection. Most of the time, these infections are from viruses (viral) and are part of a cold. However, sometimes pharyngitis is caused by bacteria (bacterial). Pharyngitis can also be caused by allergies. Viral pharyngitis may be spread from person to person by coughing, sneezing, and personal items or utensils (cups, forks, spoons, toothbrushes). Bacterial pharyngitis may be spread from person to person by more intimate contact, such as kissing.  SIGNS AND SYMPTOMS  Symptoms of pharyngitis include:   Sore throat.   Tiredness (fatigue).  Low-grade fever.   Headache.  Joint pain and muscle aches.  Skin rashes.  Swollen lymph nodes.  Plaque-like film on throat or tonsils (often seen with bacterial pharyngitis). DIAGNOSIS  Your health care provider will ask you questions about your illness and your symptoms. Your medical history, along with a physical exam, is often all that is needed to diagnose pharyngitis. Sometimes, a rapid strep test is done. Other lab  tests may also be done, depending on the suspected cause.  TREATMENT  Viral pharyngitis will usually get better in 3-4 days without the use of medicine. Bacterial pharyngitis is treated with medicines that kill germs (antibiotics).  HOME CARE INSTRUCTIONS   Drink enough water and fluids to keep your urine clear or pale yellow.   Only take over-the-counter or prescription medicines as directed by your health care provider:   If you are prescribed antibiotics, make sure you finish them even if you start to feel better.   Do not take aspirin.   Get lots of rest.   Gargle with 8 oz of salt water ( tsp of salt per 1 qt of water) as often as every 1-2 hours to soothe your throat.   Throat lozenges (if you are not at risk for choking) or sprays may be used to soothe your throat. SEEK MEDICAL CARE IF:   You have large, tender lumps in your neck.  You have a rash.  You cough up green, yellow-brown, or bloody spit. SEEK IMMEDIATE MEDICAL CARE IF:   Your neck becomes stiff.  You drool or are unable to swallow liquids.  You vomit or are unable to keep medicines or liquids down.  You have severe pain that does not go away with the use of recommended medicines.  You have trouble breathing (not caused by a stuffy nose). MAKE SURE YOU:   Understand these instructions.  Will watch your condition.  Will get help right away if you are not doing well or get worse.   This information is not intended to replace advice given to you by your health care provider. Make sure you discuss any questions you have with your health care provider.   Document Released: 04/22/2005 Document Revised: 02/10/2013 Document Reviewed: 12/28/2012 Elsevier Interactive Patient Education 2016 Elsevier Inc.  Weakness Weakness is a lack of strength. It may be felt all over the body (generalized) or in one specific part of the body (focal). Some causes of weakness can be serious. You may need further medical  evaluation, especially if you are elderly or you have a history of immunosuppression (such as chemotherapy or HIV), kidney disease, heart disease, or diabetes. CAUSES  Weakness can be caused by many different things, including:  Infection.  Physical exhaustion.  Internal bleeding or other blood loss that results in a lack of red blood cells (anemia).  Dehydration. This cause is more common in elderly people.  Side effects or electrolyte abnormalities from medicines, such as pain medicines or sedatives.  Emotional distress, anxiety, or depression.  Circulation problems, especially severe peripheral arterial disease.  Heart disease, such as rapid atrial fibrillation, bradycardia, or heart failure.  Nervous system disorders, such as Guillain-Barr syndrome, multiple sclerosis, or stroke. DIAGNOSIS  To find the cause of your weakness, your caregiver will take your history and perform a physical exam. Lab tests or X-rays may also be ordered, if needed. TREATMENT  Treatment of weakness depends on the cause of your symptoms and can vary greatly. HOME CARE INSTRUCTIONS   Rest as needed.  Eat a well-balanced diet.  Try to get some exercise every day.  Only take over-the-counter or prescription medicines as directed by your caregiver. SEEK MEDICAL CARE IF:   Your weakness seems to be getting worse or spreads to other parts of your body.  You develop new aches or pains. SEEK IMMEDIATE MEDICAL CARE IF:   You cannot perform your normal daily activities, such as getting dressed and feeding yourself.  You cannot walk up and down stairs, or you feel exhausted when you do so.  You have shortness of breath or chest pain.  You have difficulty moving parts of your body.  You have weakness in only one area of the body or on only one side of the body.  You have a fever.  You have trouble speaking or swallowing.  You cannot control your bladder or bowel movements.  You have black or  bloody vomit or stools. MAKE SURE YOU:  Understand these instructions.  Will watch your condition.  Will get help right away if you are not doing well or get worse.   This information is not intended to replace advice given to you by your health care provider. Make sure you discuss any questions you have with your health care provider.   Document Released: 04/22/2005 Document Revised: 10/22/2011 Document Reviewed: 06/21/2011 Elsevier Interactive Patient Education [employer].

## 2015-11-13 NOTE — ED Notes (Signed)
Reviewed d/c instructions, and follow-up care with pt. Pt verbalized understanding 

## 2015-11-14 LAB — CULTURE, GROUP A STREP (THRC)

## 2015-11-15 NOTE — Progress Notes (Signed)
ANTIBIOTIC CONSULT NOTE - INITIAL  Pharmacy Consult for Positive ED cultures  Indication: Throat culture- Moderate growth Group A Strep  No Known Allergies  Patient Measurements: Height: 5\' 8"  (172.7 cm) Weight: 240 lb (108.863 kg) IBW/kg (Calculated) : 68.4   Recent Labs  11/12/15 2155  WBC 11.3*  HGB 15.1  PLT 147*  CREATININE 1.25*   Estimated Creatinine Clearance: 86.5 mL/min (by C-G formula based on Cr of 1.25). No results for input(s): VANCOTROUGH, VANCOPEAK, VANCORANDOM, GENTTROUGH, GENTPEAK, GENTRANDOM, TOBRATROUGH, TOBRAPEAK, TOBRARND, AMIKACINPEAK, AMIKACINTROU, AMIKACIN in the last 72 hours.   Microbiology: Recent Results (from the past 720 hour(s))  Culture, group A strep     Status: None   Collection Time: 11/12/15  9:51 PM  Result Value Ref Range Status   Specimen Description THROAT  Final   Special Requests NONE  Final   Culture MODERATE GROUP A STREP (S.PYOGENES) ISOLATED  Final   Report Status 11/14/2015 FINAL  Final    Assessment: 50 yo male who presented to ED on 11/13/15 with weakness, sore throat, and headache.  Throat culture results positive for moderate growth group A Strep.    Plan:   Dr. Lenise Arena agreed with RX for Augmentin 875mg  BID x 10day.   Phone number in patient's record not active at this time. Patient was unable to be reached to discuss results and antibiotic. NO PCP on file for patient follow up.    Nancy Fetter, PharmD Clinical Pharmacist 11/15/2015 3:11 PM

## 2016-06-11 ENCOUNTER — Emergency Department: Payer: Self-pay

## 2016-06-11 ENCOUNTER — Encounter: Payer: Self-pay | Admitting: Emergency Medicine

## 2016-06-11 ENCOUNTER — Emergency Department
Admission: EM | Admit: 2016-06-11 | Discharge: 2016-06-11 | Disposition: A | Discharge status: Home / Self Care | Payer: Self-pay | Attending: Emergency Medicine | Admitting: Emergency Medicine

## 2016-06-11 DIAGNOSIS — F17210 Nicotine dependence, cigarettes, uncomplicated: Secondary | ICD-10-CM | POA: Insufficient documentation

## 2016-06-11 DIAGNOSIS — Y999 Unspecified external cause status: Secondary | ICD-10-CM | POA: Insufficient documentation

## 2016-06-11 DIAGNOSIS — Y939 Activity, unspecified: Secondary | ICD-10-CM | POA: Insufficient documentation

## 2016-06-11 DIAGNOSIS — X58XXXA Exposure to other specified factors, initial encounter: Secondary | ICD-10-CM | POA: Insufficient documentation

## 2016-06-11 DIAGNOSIS — S39012A Strain of muscle, fascia and tendon of lower back, initial encounter: Secondary | ICD-10-CM | POA: Insufficient documentation

## 2016-06-11 DIAGNOSIS — R079 Chest pain, unspecified: Secondary | ICD-10-CM | POA: Insufficient documentation

## 2016-06-11 DIAGNOSIS — Y929 Unspecified place or not applicable: Secondary | ICD-10-CM | POA: Insufficient documentation

## 2016-06-11 DIAGNOSIS — R0602 Shortness of breath: Secondary | ICD-10-CM | POA: Insufficient documentation

## 2016-06-11 DIAGNOSIS — D171 Benign lipomatous neoplasm of skin and subcutaneous tissue of trunk: Secondary | ICD-10-CM | POA: Insufficient documentation

## 2016-06-11 LAB — URINALYSIS, COMPLETE (UACMP) WITH MICROSCOPIC
Bacteria, UA: NONE SEEN
Bilirubin Urine: NEGATIVE
Glucose, UA: NEGATIVE mg/dL
Ketones, ur: NEGATIVE mg/dL
Leukocytes, UA: NEGATIVE
Nitrite: NEGATIVE
Protein, ur: NEGATIVE mg/dL
RBC / HPF: NONE SEEN RBC/hpf (ref 0–5)
Specific Gravity, Urine: 1.017 (ref 1.005–1.030)
Squamous Epithelial / HPF: NONE SEEN
pH: 5 (ref 5.0–8.0)

## 2016-06-11 LAB — BASIC METABOLIC PANEL
ANION GAP: 13 (ref 5–15)
BUN: 18 mg/dL (ref 6–20)
CALCIUM: 8.9 mg/dL (ref 8.9–10.3)
CO2: 17 mmol/L — ABNORMAL LOW (ref 22–32)
Chloride: 107 mmol/L (ref 101–111)
Creatinine, Ser: 1.19 mg/dL (ref 0.61–1.24)
GFR calc Af Amer: >60 mL/min (ref >60)
Glucose, Bld: 108 mg/dL — ABNORMAL HIGH (ref 65–99)
Potassium: 4.6 mmol/L (ref 3.5–5.1)
SODIUM: 137 mmol/L (ref 135–145)

## 2016-06-11 LAB — CBC
HEMATOCRIT: 45.1 % (ref 40.0–52.0)
HEMOGLOBIN: 16.1 g/dL (ref 13.0–18.0)
MCH: 32.5 pg (ref 26.0–34.0)
MCHC: 35.7 g/dL (ref 32.0–36.0)
MCV: 91 fL (ref 80.0–100.0)
PLATELETS: 208 10*3/uL (ref 150–440)
RBC: 4.96 MIL/uL (ref 4.40–5.90)
RDW: 13.9 % (ref 11.5–14.5)
WBC: 8.2 10*3/uL (ref 3.8–10.6)

## 2016-06-11 LAB — TROPONIN I

## 2016-06-11 MED ORDER — DIAZEPAM 5 MG PO TABS: 5.0000 mg | ORAL_TABLET | Freq: Three times a day (TID) | ORAL | 0 refills | Status: DC | PRN

## 2016-06-11 MED ORDER — IBUPROFEN 800 MG PO TABS: 800.0000 mg | ORAL_TABLET | Freq: Three times a day (TID) | ORAL | 0 refills | Status: DC | PRN

## 2016-06-11 MED ORDER — OXYCODONE-ACETAMINOPHEN 5-325 MG PO TABS
2.0000 | ORAL_TABLET | Freq: Once | ORAL | Status: AC
  Administered 2016-06-11: 2 via ORAL
  Filled 2016-06-11: qty 2

## 2016-06-11 MED ORDER — IOPAMIDOL (ISOVUE-370) INJECTION 76%
100.0000 mL | Freq: Once | INTRAVENOUS | Status: AC | PRN
  Administered 2016-06-11: 100 mL via INTRAVENOUS

## 2016-06-11 MED ORDER — DIAZEPAM 5 MG PO TABS
5.0000 mg | ORAL_TABLET | Freq: Once | ORAL | Status: AC
  Administered 2016-06-11: 5 mg via ORAL
  Filled 2016-06-11: qty 1

## 2016-06-11 NOTE — ED Provider Notes (Signed)
[facility] Emergency Department Provider Note        Time seen: ----------------------------------------- 9:14 AM on 06/11/2016 -----------------------------------------    I have reviewed the triage vital signs and the nursing notes.   HISTORY  Chief Complaint Chest Pain and Back Pain    HPI Barry Raymond is a 51 y.o. male who presents to the ER for intermittent left-sided chest pain under his breast since yesterday. Patient says shortness of breath associated with it. He has pain in his low back that is worse on the left side. Pain seems to be worse with movement. Back pain is been present for 2 days, nothing makes it better. 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   Past Medical History:  Diagnosis Date  . Lipoma of back    Noted on xray today  . Umbilical hernia     Patient Active Problem List   Diagnosis Date Noted  . Umbilical hernia [id number]    History reviewed. No pertinent surgical history.  Allergies Patient has no known allergies.  Social History Social History  Substance Use Topics  . Smoking status: Light Tobacco Smoker    Packs/day: 0.20    Years: 20.00    Types: Cigarettes  . Smokeless tobacco: Not on file  . Alcohol use Yes    Review of Systems Constitutional: Negative for fever. Cardiovascular: Positive for chest pain Respiratory: Positive for shortness of breath Gastrointestinal: Negative for abdominal pain, vomiting and diarrhea. Genitourinary: Negative for dysuria. Musculoskeletal: Positive for back pain Skin: Negative for rash. Neurological: Negative for headaches, focal weakness or numbness.  10-point ROS otherwise negative.  ____________________________________________   PHYSICAL EXAM:  VITAL SIGNS: ED Triage Vitals  Enc Vitals Group     BP 06/11/16 0752 (!) 141/72     Pulse Rate 06/11/16 0752 68     Resp 06/11/16 0752 20     Temp 06/11/16 0752 98.1 F (36.7 C)     Temp  Source 06/11/16 0752 Oral     SpO2 06/11/16 0752 97 %     Weight 06/11/16 0750 240 lb (108.9 kg)     Height 06/11/16 0750 5\' 8"  (1.727 m)     Head Circumference --      Peak Flow --      Pain Score 06/11/16 0750 9     Pain Loc --      Pain Edu? --      Excl. in [place]? --     Constitutional: Alert and oriented. I'll distress Eyes: Conjunctivae are normal. PERRL. Normal extraocular movements. ENT   Head: Normocephalic and atraumatic.   Nose: No congestion/rhinnorhea.   Mouth/Throat: Mucous membranes are moist.   Neck: No stridor. Cardiovascular: Normal rate, regular rhythm. No murmurs, rubs, or gallops. Respiratory: Normal respiratory effort without tachypnea nor retractions. Breath sounds are clear and equal bilaterally. No wheezes/rales/rhonchi. Gastrointestinal: Soft and nontender. Normal bowel sounds Musculoskeletal: Nontender with normal range of motion in all extremities. No lower extremity tenderness nor edema. Neurologic:  Normal speech and language. No gross focal neurologic deficits are appreciated.  Skin:  Skin is warm, dry and intact. Large approximately 20 cm midline lower thoracic mass. Normal overlying skin Psychiatric: Mood and affect are normal. Speech and behavior are normal.  ____________________________________________  EKG: Interpreted by me. Sinus rhythm rate of 66 bpm, normal PR interval, normal QRS, normal QT, normal axis.  ____________________________________________  ED COURSE:  Pertinent labs & imaging results that were available during  my care of the patient were reviewed by me and considered in my medical decision making (see chart for details).    Procedures ____________________________________________   LABS (pertinent positives/negatives)  Labs Reviewed  BASIC METABOLIC PANEL - Abnormal; Notable for the following:       Result Value   CO2 17 (*)    Glucose, Bld 108 (*)    All other components within normal limits  URINALYSIS,  COMPLETE (UACMP) WITH MICROSCOPIC - Abnormal; Notable for the following:    Color, Urine YELLOW (*)    APPearance CLEAR (*)    Hgb urine dipstick SMALL (*)    All other components within normal limits  CBC  TROPONIN I    RADIOLOGY  Chest x-ray IMPRESSION: Mild chronic interstitial prominence likely reflects the patient's smoking history. No pneumonia nor other acute cardiopulmonary abnormality. IMPRESSION: No evidence of thoracic or abdominal aortic dissection or aneurysm.  Mesenteric and renal arteries are widely patent without stenosis.  Moderate size fat containing periumbilical hernia is noted.  No other abnormality seen in the chest, abdomen or pelvis.    ____________________________________________  FINAL ASSESSMENT AND PLAN  Chest pain, back pain  Plan: Patient with labs and imaging as dictated above. Pain is likely muscle spasm related. He does have a large lipoma on his back and I will refer him to general surgery for follow-up. He is stable for outpatient follow-up.   Earleen Newport, MD   Note: This note was generated in part or whole with voice recognition software. Voice recognition is usually quite accurate but there are transcription errors that can and very often do occur. I apologize for any typographical errors that were not detected and corrected.     Earleen Newport, MD 06/11/16 1153

## 2016-06-11 NOTE — ED Triage Notes (Addendum)
Pt c/o intermittent left chest pain under breast since yesterday. Has had some SHOB with it. Also c/o lower back pain, across whole lower back that is worse with movement.  NAD in triage. Reports more concerned about back pain. Has seen some blood in urine. Back pain present X 2 days.

## 2016-06-11 NOTE — ED Notes (Signed)
Patient transported to CT 

## 2016-09-10 ENCOUNTER — Emergency Department: Payer: Self-pay

## 2016-09-10 DIAGNOSIS — F17210 Nicotine dependence, cigarettes, uncomplicated: Secondary | ICD-10-CM | POA: Insufficient documentation

## 2016-09-10 DIAGNOSIS — Z79899 Other long term (current) drug therapy: Secondary | ICD-10-CM | POA: Insufficient documentation

## 2016-09-10 DIAGNOSIS — D1739 Benign lipomatous neoplasm of skin and subcutaneous tissue of other sites: Secondary | ICD-10-CM | POA: Insufficient documentation

## 2016-09-10 DIAGNOSIS — Z791 Long term (current) use of non-steroidal anti-inflammatories (NSAID): Secondary | ICD-10-CM | POA: Insufficient documentation

## 2016-09-10 DIAGNOSIS — J40 Bronchitis, not specified as acute or chronic: Secondary | ICD-10-CM | POA: Insufficient documentation

## 2016-09-10 LAB — CBC
HCT: 45.1 % (ref 40.0–52.0)
Hemoglobin: 15.3 g/dL (ref 13.0–18.0)
MCH: 30.3 pg (ref 26.0–34.0)
MCHC: 34 g/dL (ref 32.0–36.0)
MCV: 89.1 fL (ref 80.0–100.0)
PLATELETS: 161 10*3/uL (ref 150–440)
RBC: 5.06 MIL/uL (ref 4.40–5.90)
RDW: 14.4 % (ref 11.5–14.5)
WBC: 6.5 10*3/uL (ref 3.8–10.6)

## 2016-09-10 LAB — BASIC METABOLIC PANEL
Anion gap: 7 (ref 5–15)
BUN: 22 mg/dL — ABNORMAL HIGH (ref 6–20)
CALCIUM: 9 mg/dL (ref 8.9–10.3)
CO2: 27 mmol/L (ref 22–32)
CREATININE: 1.31 mg/dL — AB (ref 0.61–1.24)
Chloride: 105 mmol/L (ref 101–111)
GFR calc non Af Amer: >60 mL/min (ref >60)
Glucose, Bld: 114 mg/dL — ABNORMAL HIGH (ref 65–99)
Potassium: 3.6 mmol/L (ref 3.5–5.1)
SODIUM: 139 mmol/L (ref 135–145)

## 2016-09-10 LAB — TROPONIN I

## 2016-09-10 NOTE — ED Triage Notes (Signed)
Pt ambulatory to triage. Pt has chest pain and sob for 2-3 days.  No n/v. Pain in left chest.  cig smoker.  Pt alert.

## 2016-09-11 ENCOUNTER — Emergency Department
Admission: EM | Admit: 2016-09-11 | Discharge: 2016-09-11 | Disposition: A | Discharge status: Home / Self Care | Payer: Self-pay | Attending: Emergency Medicine | Admitting: Emergency Medicine

## 2016-09-11 DIAGNOSIS — R0789 Other chest pain: Secondary | ICD-10-CM

## 2016-09-11 DIAGNOSIS — D179 Benign lipomatous neoplasm, unspecified: Secondary | ICD-10-CM

## 2016-09-11 DIAGNOSIS — J40 Bronchitis, not specified as acute or chronic: Secondary | ICD-10-CM

## 2016-09-11 MED ORDER — AZITHROMYCIN 250 MG PO TABS: ORAL_TABLET | ORAL | 0 refills | Status: DC

## 2016-09-11 MED ORDER — IPRATROPIUM-ALBUTEROL 0.5-2.5 (3) MG/3ML IN SOLN
3.0000 mL | Freq: Once | RESPIRATORY_TRACT | Status: AC
  Administered 2016-09-11: 3 mL via RESPIRATORY_TRACT
  Filled 2016-09-11: qty 3

## 2016-09-11 MED ORDER — ALBUTEROL SULFATE HFA 108 (90 BASE) MCG/ACT IN AERS: INHALATION_SPRAY | RESPIRATORY_TRACT | 1 refills | Status: DC

## 2016-09-11 MED ORDER — PREDNISONE 20 MG PO TABS: 60.0000 mg | ORAL_TABLET | Freq: Every day | ORAL | 0 refills | Status: DC

## 2016-09-11 NOTE — ED Provider Notes (Addendum)
[facility] Emergency Department Provider Note  ____________________________________________   First MD Initiated Contact with Patient 09/11/16 [phone]     (approximate)  I have reviewed the triage vital signs and the nursing notes.   HISTORY  Chief Complaint Chest Pain    HPI Barry Raymond is a 51 y.o. male with no significant medical history except as listed below but who is an every day smoker and has been for years.  He presents for evaluation of 2-3 days of increased shortness of breath and cough with some associated aching mild discomfort in his chest.  The chest discomfort is worse with cough.  He is occasionally productive of sputum.  The pain is mostly in the left side of his chest when he coughs.  Nothing in particular makes the symptoms better or worse.  He describes all the symptoms together as moderate in severity and they have been gradual in onset.  He denies fever/chills, nausea, vomiting, abdominal pain.   Past Medical History:  Diagnosis Date  . Lipoma of back    Noted on xray today  . Umbilical hernia     Patient Active Problem List   Diagnosis Date Noted  . Umbilical hernia [id number]    No past surgical history on file.  Prior to Admission medications   Medication Sig Start Date End Date Taking? Authorizing Provider  albuterol (PROVENTIL HFA;VENTOLIN HFA) 108 (90 Base) MCG/ACT inhaler Inhale 2-4 puffs by mouth every 4 hours as needed for wheezing, cough, and/or shortness of breath 09/11/16   Hinda Kehr, MD  azithromycin (ZITHROMAX) 250 MG tablet Take 2 tablets PO on day 1, then take 1 tablet PO daily for 4 more days 09/11/16   Hinda Kehr, MD  diazepam (VALIUM) 5 MG tablet Take 1 tablet (5 mg total) by mouth every 8 (eight) hours as needed for muscle spasms. 06/11/16   Earleen Newport, MD  ibuprofen (ADVIL,MOTRIN) 800 MG tablet Take 1 tablet (800 mg total) by mouth every 8 (eight) hours as needed. Patient not taking: Reported on  06/11/2016 12/21/14   Loney Hering, MD  ibuprofen (ADVIL,MOTRIN) 800 MG tablet Take 1 tablet (800 mg total) by mouth every 8 (eight) hours as needed. 06/11/16   Earleen Newport, MD  pantoprazole (PROTONIX) 40 MG tablet Take 1 tablet (40 mg total) by mouth daily. 03/10/15 03/09/16  Nance Pear, MD  predniSONE (DELTASONE) 20 MG tablet Take 3 tablets (60 mg total) by mouth daily. 09/11/16   Hinda Kehr, MD  sucralfate (CARAFATE) 1 G tablet Take 1 tablet (1 g total) by mouth 4 (four) times daily. Patient not taking: Reported on 06/11/2016 03/10/15   Nance Pear, MD  traMADol (ULTRAM) 50 MG tablet Take 1 tablet (50 mg total) by mouth every 6 (six) hours as needed. Patient not taking: Reported on 06/11/2016 12/21/14   Loney Hering, MD    Allergies Patient has no known allergies.  No family history on file.  Social History Social History  Substance Use Topics  . Smoking status: Light Tobacco Smoker    Packs/day: 0.20    Years: 20.00    Types: Cigarettes  . Smokeless tobacco: Not on file  . Alcohol use Yes    Review of Systems Constitutional: No fever/chills Eyes: No visual changes. ENT: No sore throat. Cardiovascular: Chest discomfort mostly associated with coughing and shortness of breath Respiratory: Denies shortness of breath. Gastrointestinal: No abdominal pain.  No nausea, no vomiting.  No diarrhea.  No constipation. Genitourinary:  Negative for dysuria. Musculoskeletal: Negative for back pain. Integumentary: Negative for rash. Neurological: Negative for headaches, focal weakness or numbness.   ____________________________________________   PHYSICAL EXAM:  VITAL SIGNS: ED Triage Vitals  Enc Vitals Group     BP 09/10/16 2123 130/75     Pulse Rate 09/10/16 2123 87     Resp 09/10/16 2123 (!) 22     Temp 09/10/16 2123 98.6 F (37 C)     Temp Source 09/10/16 2123 Oral     SpO2 09/10/16 2123 96 %     Weight 09/10/16 2124 240 lb (108.9 kg)     Height  09/10/16 2124 5\' 8"  (1.727 m)     Head Circumference --      Peak Flow --      Pain Score 09/10/16 2123 6     Pain Loc --      Pain Edu? --      Excl. in [place]? --     Constitutional: Alert and oriented. Well appearing and in no acute distress. Eyes: Conjunctivae are normal. PERRL. EOMI. Head: Atraumatic. Nose: No congestion/rhinnorhea. Mouth/Throat: Mucous membranes are moist. Neck: No stridor.  No meningeal signs.   Cardiovascular: Normal rate, regular rhythm. Good peripheral circulation. Grossly normal heart sounds. Respiratory: Normal respiratory effort.  No retractions. Mild to moderate expiratory wheezing throughout lung fields Gastrointestinal: Soft and nontender. No distention.  Musculoskeletal: No lower extremity tenderness nor edema. No gross deformities of extremities. Neurologic:  Normal speech and language. No gross focal neurologic deficits are appreciated.  Skin:  Skin is warm, dry and intact. No rash noted.  Extremely large lipoma on the back with no evidence of infection and no tenderness (chronic) Psychiatric: Mood and affect are normal. Speech and behavior are normal.  ____________________________________________   LABS (all labs ordered are listed, but only abnormal results are displayed)  Labs Reviewed  BASIC METABOLIC PANEL - Abnormal; Notable for the following:       Result Value   Glucose, Bld 114 (*)    BUN 22 (*)    Creatinine, Ser 1.31 (*)    All other components within normal limits  CBC  TROPONIN I   ____________________________________________  EKG  ED ECG REPORT I, Italo Banton, the attending physician, personally viewed and interpreted this ECG.  Date: 09/10/2016 EKG Time: 21:16 Rate: 84 Rhythm: normal sinus rhythm QRS Axis: normal Intervals: normal ST/T Wave abnormalities: normal Conduction Disturbances: none Narrative Interpretation: unremarkable  ____________________________________________  RADIOLOGY   Dg Chest 2  View  Result Date: 09/10/2016 CLINICAL DATA:  Acute onset of left-sided chest pain and shortness of breath. Initial encounter. EXAM: CHEST  2 VIEW COMPARISON:  Chest radiograph and CTA of the chest performed 06/11/2016 FINDINGS: The lungs are well-aerated. Mild bibasilar atelectasis is noted. Peribronchial thickening is seen. There is no evidence of pleural effusion or pneumothorax. The heart is normal in size; the mediastinal contour is within normal limits. No acute osseous abnormalities are seen. IMPRESSION: Mild bibasilar atelectasis noted.  Peribronchial thickening seen. Electronically Signed   By: Garald Balding M.D.   On: 09/10/2016 21:46    ____________________________________________   PROCEDURES  Critical Care performed: No   Procedure(s) performed:   Procedures   ____________________________________________   INITIAL IMPRESSION / ASSESSMENT AND PLAN / ED COURSE  Pertinent labs & imaging results that were available during my care of the patient were reviewed by me and considered in my medical decision making (see chart for details).  Well-appearing, NAD, no increased work  of breathing.  CXR and HPI consistent with bronchitis.  No indication for repeat troponin given low risk for ACS and signs/symptoms much more suggestive of bronchitis.  Patient comfortable with the plan for duoneb and outpatient treatment.    I gave my usual and customary return precautions.         ____________________________________________  FINAL CLINICAL IMPRESSION(S) / ED DIAGNOSES  Final diagnoses:  Bronchitis  Atypical chest pain  Lipoma, unspecified site     MEDICATIONS GIVEN DURING THIS VISIT:  Medications  ipratropium-albuterol (DUONEB) 0.5-2.5 (3) MG/3ML nebulizer solution 3 mL (3 mLs Nebulization Given 09/11/16 0312)     NEW OUTPATIENT MEDICATIONS STARTED DURING THIS VISIT:  Discharge Medication List as of 09/11/2016  3:28 AM    START taking these medications   Details   albuterol (PROVENTIL HFA;VENTOLIN HFA) 108 (90 Base) MCG/ACT inhaler Inhale 2-4 puffs by mouth every 4 hours as needed for wheezing, cough, and/or shortness of breath, Print    azithromycin (ZITHROMAX) 250 MG tablet Take 2 tablets PO on day 1, then take 1 tablet PO daily for 4 more days, Print    predniSONE (DELTASONE) 20 MG tablet Take 3 tablets (60 mg total) by mouth daily., Starting Wed 09/11/2016, Print        Discharge Medication List as of 09/11/2016  3:28 AM      Discharge Medication List as of 09/11/2016  3:28 AM       Note:  This document was prepared using Dragon voice recognition software and may include unintentional dictation errors.    Hinda Kehr, MD 09/11/16 8250    Hinda Kehr, MD 09/11/16 [phone]

## 2016-09-11 NOTE — ED Notes (Signed)

## 2016-09-19 ENCOUNTER — Ambulatory Visit (INDEPENDENT_AMBULATORY_CARE_PROVIDER_SITE_OTHER): Payer: Self-pay | Admitting: Surgery

## 2016-09-19 ENCOUNTER — Encounter: Payer: Self-pay | Admitting: Surgery

## 2016-09-19 ENCOUNTER — Other Ambulatory Visit
Admission: RE | Admit: 2016-09-19 | Discharge: 2016-09-19 | Disposition: A | Discharge status: Home / Self Care | Payer: Self-pay | Source: Ambulatory Visit | Attending: Surgery | Admitting: Surgery

## 2016-09-19 VITALS — BP 119/79 | HR 132 | Temp 97.8°F | Ht 68.0 in | Wt 264.0 lb

## 2016-09-19 DIAGNOSIS — D171 Benign lipomatous neoplasm of skin and subcutaneous tissue of trunk: Secondary | ICD-10-CM

## 2016-09-19 LAB — PROTIME-INR
INR: 0.92
Prothrombin Time: 12.3 seconds (ref 11.4–15.2)

## 2016-09-19 LAB — APTT: aPTT: 30 seconds (ref 24–36)

## 2016-09-19 NOTE — Progress Notes (Signed)
Surgical Consultation  09/19/2016  Barry Raymond is an 51 y.o. male.   Chief Complaint  Patient presents with  . Follow-up    Mid Back Lipoma-Patient went to the ED on 09/11/2016     HPI: 51 year old male seen for a large subcutaneous tissue mass located on his back. He reports that he has had this mass for several years but over the last couple years he has been increasing in size. He experiences some itching some discomfort and some occasional mild pain asked to see with it. He reports that the baby the symptoms are worse when he lays on his back. No fevers no chills. No weight loss. No history of cancer. No evidence of soft tissue sarcomas in the past. He recently went to the emergency room for shortness of breath and a CT scan of the chest for  was performed. I have personally reviewed his images and there is a large subcutaneous soft tissue mass. There is no evidence of solid component to CSR, tumors component. This does not seem to be a relationship with the spine or neuro axial structures. He smokes about a third of a pack a day but is able to perform more than 4 Mets of activity without any shortness of breath or chest pain. I have reviewed his labs and his CBC and his BMP is unremarkable with a slight increase in creatinine 1.3.  Past Medical History:  Diagnosis Date  . COPD (chronic obstructive pulmonary disease) ([place])   . Lipoma of back    Noted on xray today  . Umbilical hernia     No past surgical history on file.  No family history on file.  Social History:  reports that he has been smoking Cigarettes.  He has a 4.00 pack-year smoking history. He has never used smokeless tobacco. He reports that he drinks alcohol. He reports that he does not use drugs.  Allergies: No Known Allergies  Medications reviewed.  ROS Full ROS performed and is otherwise negative other than what is stated in the HPI    BP 119/79   Pulse (!) 132   Temp 97.8 F (36.6 C) (Oral)   Ht 5\' 8"   (1.727 m)   Wt 119.7 kg (264 lb)   BMI 40.14 kg/m   Physical Exam  Constitutional: He is oriented to person, place, and time and well-developed, well-nourished, and in no distress. No distress.  Eyes: Right eye exhibits no discharge. Left eye exhibits no discharge. No scleral icterus.  Neck: Normal range of motion. No JVD present. No tracheal deviation present.  Cardiovascular: Normal rate, regular rhythm and normal heart sounds.   No murmur heard. Pulmonary/Chest: Effort normal. No respiratory distress. He has no wheezes. He has no rales.  Abdominal: Soft. He exhibits no distension and no mass. There is no tenderness. There is no rebound and no guarding.  Reducible [facility]  Musculoskeletal: Normal range of motion. He exhibits no edema.  Neurological: He is alert and oriented to person, place, and time. Gait normal. GCS score is 15.  Skin: Skin is warm and dry. He is not diaphoretic.  There is a large sub q mass measuring 25cm x 20 cms, mobile, soft, not attached to deep tissues. Non tender Located midline lower thoracic level There is an additional 6 cms lipoma cephalad to the larger lesion  Psychiatric: Mood, memory, affect and judgment normal.  Nursing note and vitals reviewed.   Assessment/Plan: Large subcutaneous soft tissue mass in the back in need for further  workup to rule out any liposarcoma. I will order an MRI. We will also check some coags. Once we determined that this is not a sarcomatous component we may be able to schedule him in the operating room for an excision. First of is that we'll like to rule out a potential cancers mass that may require transfer the patient to a surgical oncologist. Discussed with him in detail about his disease process. He understands and all the questions were answered. We'll see him back in a couple weeks with results Caroleen Hamman, MD [place] Surgeon

## 2016-09-19 NOTE — Patient Instructions (Addendum)
Please go to the lab [facility]) and have your blood drawn. I will call you with results.  Please go and have your MRI done so we know what you have on your back.  Please fill out your application as soon as possible so they could let you know how much you have to pay, if any.  We will see you back in three weeks to go over your MRI results.

## 2016-10-02 ENCOUNTER — Ambulatory Visit
Admission: RE | Admit: 2016-10-02 | Discharge: 2016-10-02 | Disposition: A | Discharge status: Home / Self Care | Payer: Self-pay | Source: Ambulatory Visit | Attending: Surgery | Admitting: Surgery

## 2016-10-02 ENCOUNTER — Other Ambulatory Visit: Payer: Self-pay

## 2016-10-02 DIAGNOSIS — D171 Benign lipomatous neoplasm of skin and subcutaneous tissue of trunk: Secondary | ICD-10-CM | POA: Insufficient documentation

## 2016-10-02 MED ORDER — GADOBENATE DIMEGLUMINE 529 MG/ML IV SOLN
20.0000 mL | Freq: Once | INTRAVENOUS | Status: AC | PRN
  Administered 2016-10-02: 20 mL via INTRAVENOUS

## 2016-10-04 ENCOUNTER — Telehealth: Payer: Self-pay

## 2016-10-04 NOTE — Telephone Encounter (Signed)
Called patient but was not able to leave him a voicemail on his cell phone. However, I called his emergency contact (wife) and left her a voicemail to return my call.  Patient's MRI results were benign (Lipoma on his back).  New Appointment time is at 8:00 AM on 10/10/2016.

## 2016-10-07 NOTE — Telephone Encounter (Signed)
Patient has called back and information was given to the patient about MRI results. Patient was also reminded of his appointment with Dr Dahlia Byes on 10/10/16 at 8:00am.

## 2016-10-10 ENCOUNTER — Telehealth: Payer: Self-pay

## 2016-10-10 ENCOUNTER — Ambulatory Visit: Payer: Self-pay | Admitting: Surgery

## 2016-10-10 ENCOUNTER — Ambulatory Visit (INDEPENDENT_AMBULATORY_CARE_PROVIDER_SITE_OTHER): Payer: Self-pay | Admitting: Surgery

## 2016-10-10 ENCOUNTER — Encounter: Payer: Self-pay | Admitting: Surgery

## 2016-10-10 VITALS — BP 119/78 | HR 80 | Temp 98.0°F | Ht 68.0 in | Wt 266.0 lb

## 2016-10-10 DIAGNOSIS — D171 Benign lipomatous neoplasm of skin and subcutaneous tissue of trunk: Secondary | ICD-10-CM

## 2016-10-10 NOTE — Telephone Encounter (Signed)
Left message on patients voicemali letting him know the exact date of surgery that we have scheduled for him which is 7/3 with Dr. Dahlia Byes for a lipoma removal.

## 2016-10-10 NOTE — Progress Notes (Signed)
  Surgical Consultation  10/10/2016  Barry Raymond is an 51 y.o. male.   Chief Complaint  Patient presents with  . Follow-up    Mid-Back Lipoma     HPI: F/u for two lipoma soft tissue back. MRI personal review there 2 large lipomas no evidence of sarcoma. Scars with the patient in detail about findings. He still wishes to have this lipoma was removed. Given the complexity and the size we will perform this in the operating room under general anesthetic Discussed with the patient in detail about the procedure. Risks, benefits and possible complications including but not limited to: Bleeding, infection, chronic pain, the need for re-intervention. The chance of seroma formation and wound infection. I encouraged him to stop smoking.  Past Medical History:  Diagnosis Date  . COPD (chronic obstructive pulmonary disease) ([place])   . Lipoma of back    Noted on xray today  . Umbilical hernia     History reviewed. No pertinent surgical history.  History reviewed. No pertinent family history.  Social History:  reports that he has been smoking Cigarettes.  He has a 4.00 pack-year smoking history. He has never used smokeless tobacco. He reports that he drinks alcohol. He reports that he does not use drugs.  Allergies: No Known Allergies  Medications reviewed.     ROS Full ROS performed and is otherwise negative other than what is stated in the HPI    BP 119/78   Pulse 80   Temp 98 F (36.7 C) (Oral)   Ht 5\' 8"  (1.727 m)   Wt 120.7 kg (266 lb)   BMI 40.45 kg/m   Physical Exam  Constitutional: He is oriented to person, place, and time and well-developed, well-nourished, and in no distress. No distress.  Eyes: Right eye exhibits no discharge. Left eye exhibits no discharge. No scleral icterus.  Neck: Neck supple. No tracheal deviation present. No thyromegaly present.  Cardiovascular: Normal rate and normal heart sounds.   Pulmonary/Chest: Effort normal. No respiratory distress. He  has no wheezes. He has no rales.  Abdominal: Soft. He exhibits no distension. There is no tenderness. There is no rebound and no guarding.  Musculoskeletal: Normal range of motion. He exhibits no edema or deformity.  Lymphadenopathy:    He has no cervical adenopathy.  Neurological: He is alert and oriented to person, place, and time. Gait normal. GCS score is 15.  Skin: Skin is warm and dry.  There are two large low thoracic soft tissue masses, mobile and non tender  Psychiatric: Mood, memory, affect and judgment normal.  Nursing note and vitals reviewed.     No results found for this or any previous visit (from the past 48 hour(s)). No results found.  Assessment/Plan: Large symptomatic lipomas in need for excision. We'll schedule for excision in the OR under general anesthetic and prone position Caroleen Hamman, MD [place] Surgeon

## 2016-10-10 NOTE — Patient Instructions (Addendum)
We will call you with your surgery update if scheduled after 30 days of today's visit we will have to see you back in office.  We strongly encourage you to try to stop smoking prior to having this procedure to help with the healing process.  Please see your blue sheet for further instruction.

## 2016-10-11 ENCOUNTER — Telehealth: Payer: Self-pay | Admitting: Surgery

## 2016-10-11 NOTE — Telephone Encounter (Signed)
I have called patient again to go over surgery information. No answer on emergency contact. I have left a message on voicemail.

## 2016-10-11 NOTE — Telephone Encounter (Signed)
I have called Pt to advise him of pre op date/time and sx date. NO answer. I have left a message on voicemail.  Sx: 11/05/16 with Dr Pabon--Excision of lipoma on back times 2.  Pre op: 10/29/16 between 9-1:00pm--Phone.   Patient made aware to call [phone], between 1-3:00pm the day before surgery, to find out what time to arrive.     Physician estimate: 366.00

## 2016-10-11 NOTE — Telephone Encounter (Signed)
Patient's wife has called and all information has been given.

## 2016-10-29 ENCOUNTER — Encounter
Admission: RE | Admit: 2016-10-29 | Discharge: 2016-10-29 | Disposition: A | Discharge status: Home / Self Care | Payer: Self-pay | Source: Ambulatory Visit | Attending: Surgery | Admitting: Surgery

## 2016-10-29 HISTORY — DX: Sleep apnea, unspecified: G47.30

## 2016-10-29 HISTORY — DX: Gastro-esophageal reflux disease without esophagitis: K21.9

## 2016-10-29 NOTE — Patient Instructions (Signed)
  Your procedure is scheduled on: 11-05-16   Report to Same Day Surgery 2nd floor medical mall [facility] Entrance-take elevator on left to 2nd floor.  Check in with surgery information desk.) To find out your arrival time please call [phone] between 1PM - 3PM on 11-04-16  Remember: Instructions that are not followed completely may result in serious medical risk, up to and including death, or upon the discretion of your surgeon and anesthesiologist your surgery may need to be rescheduled.    _x___ 1. Do not eat food or drink liquids after midnight. No gum chewing or hard candies.     __x__ 2. No Alcohol for 24 hours before or after surgery.   __x__3. No Smoking for 24 prior to surgery.   ____  4. Bring all medications with you on the day of surgery if instructed.    __x__ 5. Notify your doctor if there is any change in your medical condition     (cold, fever, infections).     Do not wear jewelry, make-up, hairpins, clips or nail polish.  Do not wear lotions, powders, or perfumes. You may wear deodorant.  Do not shave 48 hours prior to surgery. Men may shave face and neck.  Do not bring valuables to the hospital.    [facility] is not responsible for any belongings or valuables.               Contacts, dentures or bridgework may not be worn into surgery.  Leave your suitcase in the car. After surgery it may be brought to your room.  For patients admitted to the hospital, discharge time is determined by your treatment team.   Patients discharged the day of surgery will not be allowed to drive home.  You will need someone to drive you home and stay with you the night of your procedure.    Please read over the following fact sheets that you were given:    ____ Take anti-hypertensive (unless it includes a diuretic), cardiac, seizure, asthma,     anti-reflux and psychiatric medicines. These include:  1. NONE  2.  3.  4.  5.  6.  ____Fleets enema or Magnesium Citrate as  directed.   ____ Use CHG Soap or sage wipes as directed on instruction sheet   ____ Use inhalers on the day of surgery and bring to hospital day of surgery  ____ Stop Metformin and Janumet 2 days prior to surgery.    ____ Take 1/2 of usual insulin dose the night before surgery and none on the morning     surgery.   ____ Follow recommendations from Cardiologist, Pulmonologist or PCP regarding stopping Aspirin, Coumadin, Pllavix ,Eliquis, Effient, or Pradaxa, and Pletal.  X____Stop Anti-inflammatories such as Advil, Aleve, Ibuprofen, Motrin, Naproxen, Naprosyn, Goodies powders or aspirin products NOW-OK to take Tylenol   ____ Stop supplements until after surgery.     ____ Bring C-Pap to the hospital.

## 2016-11-04 MED ORDER — DEXTROSE 5 % IV SOLN
3.0000 g | INTRAVENOUS | Status: AC
  Administered 2016-11-05: 3 g via INTRAVENOUS
  Filled 2016-11-04: qty 3000

## 2016-11-05 ENCOUNTER — Encounter
Admission: RE | Disposition: A | Discharge status: Home / Self Care | Payer: Self-pay | Source: Ambulatory Visit | Attending: Surgery

## 2016-11-05 ENCOUNTER — Ambulatory Visit: Payer: Self-pay | Admitting: Anesthesiology

## 2016-11-05 ENCOUNTER — Observation Stay
Admission: RE | Admit: 2016-11-05 | Discharge: 2016-11-07 | Disposition: A | Discharge status: Home / Self Care | Payer: Self-pay | Source: Ambulatory Visit | Attending: Surgery | Admitting: Surgery

## 2016-11-05 ENCOUNTER — Encounter: Payer: Self-pay | Admitting: *Deleted

## 2016-11-05 DIAGNOSIS — Z86018 Personal history of other benign neoplasm: Secondary | ICD-10-CM

## 2016-11-05 DIAGNOSIS — Y838 Other surgical procedures as the cause of abnormal reaction of the patient, or of later complication, without mention of misadventure at the time of the procedure: Secondary | ICD-10-CM | POA: Insufficient documentation

## 2016-11-05 DIAGNOSIS — Y92530 Ambulatory surgery center as the place of occurrence of the external cause: Secondary | ICD-10-CM | POA: Insufficient documentation

## 2016-11-05 DIAGNOSIS — F17210 Nicotine dependence, cigarettes, uncomplicated: Secondary | ICD-10-CM | POA: Insufficient documentation

## 2016-11-05 DIAGNOSIS — J449 Chronic obstructive pulmonary disease, unspecified: Secondary | ICD-10-CM | POA: Insufficient documentation

## 2016-11-05 DIAGNOSIS — D171 Benign lipomatous neoplasm of skin and subcutaneous tissue of trunk: Principal | ICD-10-CM | POA: Insufficient documentation

## 2016-11-05 DIAGNOSIS — T148XXA Other injury of unspecified body region, initial encounter: Secondary | ICD-10-CM

## 2016-11-05 DIAGNOSIS — Z9889 Other specified postprocedural states: Secondary | ICD-10-CM

## 2016-11-05 DIAGNOSIS — L7632 Postprocedural hematoma of skin and subcutaneous tissue following other procedure: Secondary | ICD-10-CM | POA: Insufficient documentation

## 2016-11-05 HISTORY — PX: LIPOMA EXCISION: SHX5283

## 2016-11-05 LAB — HEMOGLOBIN AND HEMATOCRIT, BLOOD
HCT: 37.9 % — ABNORMAL LOW (ref 40.0–52.0)
Hemoglobin: 13.1 g/dL (ref 13.0–18.0)

## 2016-11-05 LAB — ABO/RH: ABO/RH(D): O POS

## 2016-11-05 SURGERY — EXCISION LIPOMA
Anesthesia: General | Wound class: Clean

## 2016-11-05 SURGERY — EXCISION LIPOMA
Anesthesia: General | Site: Back | Wound class: Clean

## 2016-11-05 MED ORDER — CHLORHEXIDINE GLUCONATE CLOTH 2 % EX PADS: 6.0000 | MEDICATED_PAD | Freq: Once | CUTANEOUS | Status: DC

## 2016-11-05 MED ORDER — ONDANSETRON 4 MG PO TBDP: 4.0000 mg | ORAL_TABLET | Freq: Four times a day (QID) | ORAL | Status: DC | PRN

## 2016-11-05 MED ORDER — SUCCINYLCHOLINE CHLORIDE 20 MG/ML IJ SOLN
INTRAMUSCULAR | Status: DC | PRN
  Administered 2016-11-05: 120 mg via INTRAVENOUS

## 2016-11-05 MED ORDER — MIDAZOLAM HCL 2 MG/2ML IJ SOLN
INTRAMUSCULAR | Status: DC | PRN
  Administered 2016-11-05: 2 mg via INTRAVENOUS

## 2016-11-05 MED ORDER — LACTATED RINGERS IV SOLN
INTRAVENOUS | Status: DC
  Administered 2016-11-05 (×2): via INTRAVENOUS

## 2016-11-05 MED ORDER — SEVOFLURANE IN SOLN
RESPIRATORY_TRACT | Status: AC
  Filled 2016-11-05: qty 250

## 2016-11-05 MED ORDER — LIDOCAINE HCL 2 % EX GEL
CUTANEOUS | Status: AC
  Filled 2016-11-05: qty 5

## 2016-11-05 MED ORDER — FENTANYL CITRATE (PF) 100 MCG/2ML IJ SOLN
INTRAMUSCULAR | Status: AC
  Filled 2016-11-05: qty 2

## 2016-11-05 MED ORDER — BUPIVACAINE-EPINEPHRINE (PF) 0.25% -1:200000 IJ SOLN
INTRAMUSCULAR | Status: AC
  Filled 2016-11-05: qty 30

## 2016-11-05 MED ORDER — PROPOFOL 10 MG/ML IV BOLUS
INTRAVENOUS | Status: DC | PRN
  Administered 2016-11-05: 130 mg via INTRAVENOUS

## 2016-11-05 MED ORDER — BUPIVACAINE LIPOSOME 1.3 % IJ SUSP
INTRAMUSCULAR | Status: DC | PRN
  Administered 2016-11-05: 20 mL

## 2016-11-05 MED ORDER — ACETAMINOPHEN 10 MG/ML IV SOLN
INTRAVENOUS | Status: AC
  Filled 2016-11-05: qty 100

## 2016-11-05 MED ORDER — MINERAL OIL LIGHT 100 % EX OIL
TOPICAL_OIL | CUTANEOUS | Status: AC
  Filled 2016-11-05: qty 25

## 2016-11-05 MED ORDER — ONDANSETRON HCL 4 MG/2ML IJ SOLN
INTRAMUSCULAR | Status: DC | PRN
  Administered 2016-11-05: 4 mg via INTRAVENOUS

## 2016-11-05 MED ORDER — ROCURONIUM BROMIDE 100 MG/10ML IV SOLN
INTRAVENOUS | Status: DC | PRN
  Administered 2016-11-05 (×2): 10 mg via INTRAVENOUS
  Administered 2016-11-05: 40 mg via INTRAVENOUS
  Administered 2016-11-05: 15 mg via INTRAVENOUS

## 2016-11-05 MED ORDER — FENTANYL CITRATE (PF) 100 MCG/2ML IJ SOLN
25.0000 ug | INTRAMUSCULAR | Status: DC | PRN
  Administered 2016-11-05: 50 ug via INTRAVENOUS

## 2016-11-05 MED ORDER — KETOROLAC TROMETHAMINE 30 MG/ML IJ SOLN
INTRAMUSCULAR | Status: DC | PRN
  Administered 2016-11-05: 30 mg via INTRAVENOUS

## 2016-11-05 MED ORDER — FAMOTIDINE 20 MG PO TABS
20.0000 mg | ORAL_TABLET | Freq: Once | ORAL | Status: AC
  Administered 2016-11-05: 20 mg via ORAL

## 2016-11-05 MED ORDER — PROPOFOL 10 MG/ML IV BOLUS
INTRAVENOUS | Status: AC
  Filled 2016-11-05: qty 20

## 2016-11-05 MED ORDER — FENTANYL CITRATE (PF) 100 MCG/2ML IJ SOLN
INTRAMUSCULAR | Status: AC
Start: 2016-11-05 — End: 2016-11-06
  Filled 2016-11-05: qty 2

## 2016-11-05 MED ORDER — PANTOPRAZOLE SODIUM 40 MG IV SOLR
40.0000 mg | Freq: Every day | INTRAVENOUS | Status: DC
  Administered 2016-11-05 – 2016-11-06 (×2): 40 mg via INTRAVENOUS
  Filled 2016-11-05 (×4): qty 40

## 2016-11-05 MED ORDER — LIDOCAINE HCL (CARDIAC) 20 MG/ML IV SOLN
INTRAVENOUS | Status: DC | PRN
  Administered 2016-11-05: 40 mg via INTRAVENOUS

## 2016-11-05 MED ORDER — MORPHINE SULFATE (PF) 4 MG/ML IV SOLN
2.0000 mg | INTRAVENOUS | Status: DC | PRN
  Administered 2016-11-05 – 2016-11-07 (×2): 2 mg via INTRAVENOUS
  Filled 2016-11-05 (×2): qty 1

## 2016-11-05 MED ORDER — OXYCODONE HCL 5 MG/5ML PO SOLN: 5.0000 mg | Freq: Once | ORAL | Status: DC | PRN

## 2016-11-05 MED ORDER — ROCURONIUM BROMIDE 50 MG/5ML IV SOLN
INTRAVENOUS | Status: AC
  Filled 2016-11-05: qty 1

## 2016-11-05 MED ORDER — CEFAZOLIN SODIUM 1 G IJ SOLR
INTRAMUSCULAR | Status: DC | PRN
  Administered 2016-11-05: 1 g via INTRAMUSCULAR

## 2016-11-05 MED ORDER — KETOROLAC TROMETHAMINE 30 MG/ML IJ SOLN
INTRAMUSCULAR | Status: AC
  Filled 2016-11-05: qty 1

## 2016-11-05 MED ORDER — FENTANYL CITRATE (PF) 100 MCG/2ML IJ SOLN
25.0000 ug | INTRAMUSCULAR | Status: DC | PRN
  Administered 2016-11-05: 100 ug via INTRAVENOUS
  Administered 2016-11-05: 50 ug via INTRAVENOUS

## 2016-11-05 MED ORDER — OXYCODONE-ACETAMINOPHEN 5-325 MG PO TABS
1.0000 | ORAL_TABLET | ORAL | Status: DC | PRN
  Administered 2016-11-05 – 2016-11-06 (×2): 2 via ORAL
  Administered 2016-11-06 (×2): 1 via ORAL
  Administered 2016-11-06 – 2016-11-07 (×3): 2 via ORAL
  Filled 2016-11-05 (×3): qty 2
  Filled 2016-11-05: qty 1
  Filled 2016-11-05 (×3): qty 2

## 2016-11-05 MED ORDER — OXYCODONE HCL 5 MG PO TABS: 5.0000 mg | ORAL_TABLET | Freq: Once | ORAL | Status: DC | PRN

## 2016-11-05 MED ORDER — PHENYLEPHRINE HCL 10 MG/ML IJ SOLN
INTRAMUSCULAR | Status: DC | PRN
  Administered 2016-11-05: 100 ug via INTRAVENOUS

## 2016-11-05 MED ORDER — PROMETHAZINE HCL 25 MG/ML IJ SOLN: 6.2500 mg | INTRAMUSCULAR | Status: DC | PRN

## 2016-11-05 MED ORDER — FAMOTIDINE 20 MG PO TABS
ORAL_TABLET | ORAL | Status: AC
  Filled 2016-11-05: qty 1

## 2016-11-05 MED ORDER — MINERAL OIL LIGHT 100 % EX OIL
TOPICAL_OIL | CUTANEOUS | Status: DC | PRN
  Administered 2016-11-05: 1 via TOPICAL

## 2016-11-05 MED ORDER — MIDAZOLAM HCL 2 MG/2ML IJ SOLN
INTRAMUSCULAR | Status: AC
Start: 2016-11-05 — End: 2016-11-05
  Filled 2016-11-05: qty 2

## 2016-11-05 MED ORDER — FENTANYL CITRATE (PF) 100 MCG/2ML IJ SOLN: 25.0000 ug | INTRAMUSCULAR | Status: DC | PRN

## 2016-11-05 MED ORDER — ACETAMINOPHEN 10 MG/ML IV SOLN
INTRAVENOUS | Status: DC | PRN
  Administered 2016-11-05: 1000 mg via INTRAVENOUS

## 2016-11-05 MED ORDER — HYDROCODONE-ACETAMINOPHEN 5-325 MG PO TABS: 1.0000 | ORAL_TABLET | Freq: Four times a day (QID) | ORAL | 0 refills | Status: DC | PRN

## 2016-11-05 MED ORDER — SUGAMMADEX SODIUM 200 MG/2ML IV SOLN
INTRAVENOUS | Status: DC | PRN
  Administered 2016-11-05: 250 mg via INTRAVENOUS

## 2016-11-05 MED ORDER — FENTANYL CITRATE (PF) 100 MCG/2ML IJ SOLN
INTRAMUSCULAR | Status: DC | PRN
  Administered 2016-11-05 (×4): 50 ug via INTRAVENOUS

## 2016-11-05 MED ORDER — ONDANSETRON HCL 4 MG/2ML IJ SOLN: 4.0000 mg | Freq: Four times a day (QID) | INTRAMUSCULAR | Status: DC | PRN

## 2016-11-05 MED ORDER — PROPOFOL 10 MG/ML IV BOLUS
INTRAVENOUS | Status: DC | PRN
  Administered 2016-11-05: 200 mg via INTRAVENOUS

## 2016-11-05 MED ORDER — BUPIVACAINE LIPOSOME 1.3 % IJ SUSP
INTRAMUSCULAR | Status: AC
  Filled 2016-11-05: qty 20

## 2016-11-05 MED ORDER — BUPIVACAINE-EPINEPHRINE (PF) 0.25% -1:200000 IJ SOLN
INTRAMUSCULAR | Status: DC | PRN
Start: 2016-11-05 — End: 2016-11-05
  Administered 2016-11-05: 30 mL

## 2016-11-05 SURGICAL SUPPLY — 37 items
BLADE SURG 15 STRL LF DISP TIS (BLADE) ×1 IMPLANT
BLADE SURG 15 STRL SS (BLADE) ×2
CANISTER SUCT 1200ML W/VALVE (MISCELLANEOUS) ×3 IMPLANT
CHLORAPREP W/TINT 26ML (MISCELLANEOUS) IMPLANT
DERMABOND ADVANCED (GAUZE/BANDAGES/DRESSINGS) ×6
DERMABOND ADVANCED .7 DNX12 (GAUZE/BANDAGES/DRESSINGS) ×3 IMPLANT
DRAIN CHANNEL JP 15F RND 16 (MISCELLANEOUS) ×3 IMPLANT
DRAPE LAPAROTOMY 100X77 ABD (DRAPES) ×3 IMPLANT
DURAPREP 26ML APPLICATOR (WOUND CARE) ×3 IMPLANT
ELECT CAUTERY BLADE TIP 2.5 (TIP) ×3
ELECT REM PT RETURN 9FT ADLT (ELECTROSURGICAL) ×3
ELECTRODE CAUTERY BLDE TIP 2.5 (TIP) ×1 IMPLANT
ELECTRODE REM PT RTRN 9FT ADLT (ELECTROSURGICAL) ×1 IMPLANT
GLOVE BIO SURGEON STRL SZ7 (GLOVE) ×12 IMPLANT
GOWN STRL REUS W/ TWL LRG LVL3 (GOWN DISPOSABLE) ×4 IMPLANT
GOWN STRL REUS W/TWL LRG LVL3 (GOWN DISPOSABLE) ×8
LABEL OR SOLS (LABEL) ×3 IMPLANT
NDL SAFETY 22GX1.5 (NEEDLE) ×3 IMPLANT
NS IRRIG 500ML POUR BTL (IV SOLUTION) ×3 IMPLANT
PACK BASIN MINOR ARMC (MISCELLANEOUS) ×3 IMPLANT
SPONGE LAP 18X18 5 PK (GAUZE/BANDAGES/DRESSINGS) ×6 IMPLANT
SUCT RESERVOIR 100CC (MISCELLANEOUS) ×3 IMPLANT
SUT ETHILON 3-0 FS-10 30 BLK (SUTURE) ×3
SUT MNCRL 4-0 (SUTURE) ×6
SUT MNCRL 4-0 27XMFL (SUTURE) ×3
SUT SILK 2 0 SH (SUTURE) ×3 IMPLANT
SUT VIC AB 0 CT1 27 (SUTURE) ×2
SUT VIC AB 0 CT1 27XCR 8 STRN (SUTURE) ×1 IMPLANT
SUT VIC AB 0 CT1 36 (SUTURE) ×3 IMPLANT
SUT VIC AB 2-0 CT2 27 (SUTURE) ×3 IMPLANT
SUT VIC AB 2-0 SH 27 (SUTURE) ×6
SUT VIC AB 2-0 SH 27XBRD (SUTURE) ×3 IMPLANT
SUTURE EHLN 3-0 FS-10 30 BLK (SUTURE) ×1 IMPLANT
SUTURE MNCRL 4-0 27XMF (SUTURE) ×3 IMPLANT
SYR 20CC LL (SYRINGE) ×3 IMPLANT
SYR BULB IRRIG 60ML STRL (SYRINGE) ×3 IMPLANT
SYRINGE 10CC LL (SYRINGE) ×3 IMPLANT

## 2016-11-05 SURGICAL SUPPLY — 40 items
BLADE SURG 15 STRL LF DISP TIS (BLADE) ×1 IMPLANT
BLADE SURG 15 STRL SS (BLADE) ×1
BOWL CEMENT MIXING ADV NOZZLE (MISCELLANEOUS) ×2 IMPLANT
BULB RESERV EVAC DRAIN JP 100C (MISCELLANEOUS) ×2 IMPLANT
CANISTER SUCT 1200ML W/VALVE (MISCELLANEOUS) ×2 IMPLANT
CHLORAPREP W/TINT 26ML (MISCELLANEOUS) ×2 IMPLANT
DERMABOND ADVANCED (GAUZE/BANDAGES/DRESSINGS)
DERMABOND ADVANCED .7 DNX12 (GAUZE/BANDAGES/DRESSINGS) IMPLANT
DRAIN CHANNEL JP 19F (MISCELLANEOUS) ×2 IMPLANT
DRAPE LAPAROTOMY 100X77 ABD (DRAPES) ×2 IMPLANT
DRSG OPSITE POSTOP 4X10 (GAUZE/BANDAGES/DRESSINGS) ×2 IMPLANT
DRSG OPSITE POSTOP 4X8 (GAUZE/BANDAGES/DRESSINGS) ×4 IMPLANT
DRSG TEGADERM 4X4.75 (GAUZE/BANDAGES/DRESSINGS) ×2 IMPLANT
ELECT REM PT RETURN 9FT ADLT (ELECTROSURGICAL) ×2
ELECTRODE REM PT RTRN 9FT ADLT (ELECTROSURGICAL) ×1 IMPLANT
GLOVE BIO SURGEON STRL SZ7 (GLOVE) ×12 IMPLANT
GOWN STRL REUS W/ TWL LRG LVL3 (GOWN DISPOSABLE) ×4 IMPLANT
GOWN STRL REUS W/TWL LRG LVL3 (GOWN DISPOSABLE) ×4
LABEL OR SOLS (LABEL) ×2 IMPLANT
NDL SAFETY 22GX1.5 (NEEDLE) ×2 IMPLANT
NS IRRIG 500ML POUR BTL (IV SOLUTION) ×2 IMPLANT
PACK BASIN MINOR ARMC (MISCELLANEOUS) ×2 IMPLANT
SOL PREP PVP 2OZ (MISCELLANEOUS) ×2
SOLUTION PREP PVP 2OZ (MISCELLANEOUS) ×1 IMPLANT
SPONGE DRAIN TRACH 4X4 STRL 2S (GAUZE/BANDAGES/DRESSINGS) ×2 IMPLANT
SPONGE LAP 18X18 5 PK (GAUZE/BANDAGES/DRESSINGS) ×6 IMPLANT
STAPLER SKIN PROX 35W (STAPLE) ×4 IMPLANT
SUT ETHILON 3-0 FS-10 30 BLK (SUTURE) ×2
SUT MNCRL 4-0 (SUTURE) ×1
SUT MNCRL 4-0 27XMFL (SUTURE) ×1
SUT VIC AB 0 CT1 18XCR BRD 8 (SUTURE) ×1 IMPLANT
SUT VIC AB 0 CT1 36 (SUTURE) ×2 IMPLANT
SUT VIC AB 0 CT1 8-18 (SUTURE) ×1
SUT VIC AB 2-0 CT1 (SUTURE) ×6 IMPLANT
SUT VIC AB 2-0 CT2 27 (SUTURE) ×2 IMPLANT
SUTURE EHLN 3-0 FS-10 30 BLK (SUTURE) ×1 IMPLANT
SUTURE MNCRL 4-0 27XMF (SUTURE) ×1 IMPLANT
SYRINGE 10CC LL (SYRINGE) ×2 IMPLANT
TAPE TRANSPORE STRL 2 31045 (GAUZE/BANDAGES/DRESSINGS) ×2 IMPLANT
WATER STERILE IRR 1000ML POUR (IV SOLUTION) ×4 IMPLANT

## 2016-11-05 NOTE — Op Note (Addendum)
  11/05/2016  10:46 AM  PATIENT:  Barry Raymond  51 y.o. male  PRE-OPERATIVE DIAGNOSIS: 1. Giant Lipoma Lower Back 2. Lipoma upper Back  POST-OPERATIVE DIAGNOSIS:  Same  PROCEDURE:   1. Excision of giant lipoma on the lower back measuring 18 x 21 cm 2. Intermediate closure of a 21 cm wound lower back 3. Excision 7 cms lipoma upper back 4. Intermediate closure of a 21 cm wound upper back 5. Placement of # 15 FR blake drain  SURGEON:  Surgeon(s) and Role:    * Pabon, Diego F, MD - Primary    * Nestor Lewandowsky, MD - Assisting  ASSISTANTS: Dr. Genevive Bi  ANESTHESIA: GETA  Findings: Deep and giant lipoma of the lower back                 Medium size lipoma upper back  DICTATION:  Patient was explained about proceeding detail, risk benefits possible complications and a consent was obtained. The patient taken to the operating room and placed in the prone position.   Attention was turned to the lower back with this giant lipoma was assessed and we performed an elliptical incision incorporating the skin flap in the standard fashion with a 10 blade knife. Interference. Flaps were developed until we were able to develop a plane and the lipoma was excised and dissected free from adjacent soft tissue using electrocautery. Adequate hemostasis was obtained.Please note that this lipoma required extensive dissection from the deep subq tissues and was just above the fascia in a very deep plane.  We excised the specimen with good margins and oriented with 2 sutures for permanent pathology. Attention then was turned to the upper back lipoma where a separate incision was created with a 15 blade knife. An electric current was used to that ensued tenderness tissue and were able to dissect the lipoma from the adjacent soft tissue using electrocautery. Both of the wounds were irrigated and Liposomal Marcaine along with Marcaine quarter percent with epinephrine was injected around the wound site. A tunnel was  created with a Kelly clamp attended 2 cavities so we could tunnel a #15 Blake drain in the standard fashion. The drain was secured to the skin using 3-0 nylon's. Both of the wounds were closed in a 3 layer fashion with 0 Vicryl and 2-0 Vicryl in an interrupted fashion and 4-0 Monocryl in a subcutaneous fashion. Dermabond alone was used to coat the skin incisions.   Needle and laparotomy counts were correct and there were no immediate complications  Jules Husbands, MD

## 2016-11-05 NOTE — Transfer of Care (Signed)
Immediate Anesthesia Transfer of Care Note  Patient: Barry Raymond  Procedure(s) Performed: Procedure(s) with comments: EXCISION LIPOMA BACK x 2 (N/A) - PRONE  Patient Location: PACU  Anesthesia Type:General  Level of Consciousness: awake  Airway & Oxygen Therapy: Patient Spontanous Breathing and Patient connected to face mask oxygen  Post-op Assessment: Report given to RN and Post -op Vital signs reviewed and stable  Post vital signs: Reviewed and stable  Last Vitals:  Vitals:   11/05/16 0813  BP: (!) 130/91  Pulse: 76  Resp: 16  Temp: 36.7 C    Last Pain:  Vitals:   11/05/16 0813  TempSrc: Oral         Complications: No apparent anesthesia complications

## 2016-11-05 NOTE — Anesthesia Post-op Follow-up Note (Cosign Needed)
Anesthesia QCDR form completed.        

## 2016-11-05 NOTE — Op Note (Signed)
  PRE-OPERATIVE DIAGNOSIS: 1. Hematoma after recent surgery  POST-OPERATIVE DIAGNOSIS:  Same  PROCEDURE:   Drainage of large hematoma with hemostasis of bleeding perforating muscular branch  SURGEON:  Surgeon(s) and Role:    * Luverta Korte F, MD - Primary    * Nestor Lewandowsky, MD - Assisting  ASSISTANTS: Dr. Genevive Bi  ANESTHESIA: GETA  Findings: Large hematoma and some active bleeding from small perforating vessel coming from the muscle.   We Were called by the PACU nurse about the patient having increased in swelling at the incision site. We examined him and determined that he was a hematoma that needed to be explored. After patient was in use and administered general endotracheal anesthesia he was placed in the prone position. He was prepped and draped in the usual sterile fashion and we opened and sensation with 10 blade knife. We saw significant blood clots approximately 300 cc or so and it was appropriately evacuated. After irrigating the wounds were able to visualize a small perforating vessel that was actively bleeding. Using an electrocautery were able to control and obtained good hemostasis. The both wounds were explored and an irrigated with sterile water. After we made sure there was adequate hemostasis we closed the incisions using interrupted 0 Vicryl as well as a 2-0 Vicryl and staples for the skin. We were able also to place a larger drain and #19 Blake drain in the subtenon's tissue in the defect.

## 2016-11-05 NOTE — Transfer of Care (Signed)
Immediate Anesthesia Transfer of Care Note  Patient: Barry Raymond  Procedure(s) Performed: Procedure(s): EXCISION LIPOMA (N/A)  Patient Location: PACU  Anesthesia Type:General  Level of Consciousness: drowsy and patient cooperative  Airway & Oxygen Therapy: Patient Spontanous Breathing and Patient connected to face mask oxygen  Post-op Assessment: Report given to RN and Post -op Vital signs reviewed and stable  Post vital signs: Reviewed and stable  Last Vitals:  Vitals:   11/05/16 1458 11/05/16 1629  BP:  113/67  Pulse: 75 86  Resp:  15  Temp:  36.5 C    Last Pain:  Vitals:   11/05/16 1454  TempSrc:   PainSc: 8          Complications: No apparent anesthesia complications
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
## 2016-11-05 NOTE — Anesthesia Postprocedure Evaluation (Signed)
Anesthesia Post Note  Patient: Barry Raymond  Procedure(s) Performed: Procedure(s) (LRB): EXCISION LIPOMA (N/A)  Patient location during evaluation: PACU Anesthesia Type: General Level of consciousness: awake and alert Pain management: pain level controlled Vital Signs Assessment: post-procedure vital signs reviewed and stable Respiratory status: spontaneous breathing, nonlabored ventilation, respiratory function stable and patient connected to nasal cannula oxygen Cardiovascular status: blood pressure returned to baseline and stable Postop Assessment: no signs of nausea or vomiting Anesthetic complications: no     Last Vitals:  Vitals:   11/05/16 1732 11/05/16 1804  BP: 125/80 118/63  Pulse: 76 82  Resp: 16 16  Temp: 37 C 36.6 C    Last Pain:  Vitals:   11/05/16 1943  TempSrc:   PainSc: 1                  Martha Clan
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
## 2016-11-05 NOTE — Anesthesia Postprocedure Evaluation (Signed)
Anesthesia Post Note  Patient: Barry Raymond  Procedure(s) Performed: Procedure(s) (LRB): EXCISION LIPOMA BACK x 2 (N/A)  Patient location during evaluation: PACU Anesthesia Type: General Level of consciousness: awake and alert Pain management: pain level controlled Vital Signs Assessment: post-procedure vital signs reviewed and stable Respiratory status: spontaneous breathing, nonlabored ventilation, respiratory function stable and patient connected to nasal cannula oxygen Cardiovascular status: blood pressure returned to baseline and stable Postop Assessment: no signs of nausea or vomiting Anesthetic complications: no Comments: Plan to bring patient back to OR for post op bleed     Last Vitals:  Vitals:   11/05/16 1220 11/05/16 1234  BP: 107/68 121/80  Pulse: 76 72  Resp:  16  Temp:      Last Pain:  Vitals:   11/05/16 1230  TempSrc:   PainSc: 2                  Precious Haws Mumin Denomme

## 2016-11-05 NOTE — Anesthesia Procedure Notes (Signed)
Procedure Name: Intubation Date/Time: 11/05/2016 3:08 PM Performed by: Jonna Clark Pre-anesthesia Checklist: Patient identified, Patient being monitored, Timeout performed, Emergency Drugs available and Suction available Patient Re-evaluated:Patient Re-evaluated prior to inductionOxygen Delivery Method: Circle system utilized Preoxygenation: Pre-oxygenation with 100% oxygen Intubation Type: IV induction, Rapid sequence and Cricoid Pressure applied Ventilation: Mask ventilation without difficulty Laryngoscope Size: Miller and 2 Grade View: Grade I Tube type: Oral Tube size: 7.5 mm Number of attempts: 1 Placement Confirmation: ETT inserted through vocal cords under direct vision,  positive ETCO2 and breath sounds checked- equal and bilateral Secured at: 21 cm Tube secured with: Tape Dental Injury: Teeth and Oropharynx as per pre-operative assessment

## 2016-11-05 NOTE — H&P (View-Only) (Signed)
  Surgical Consultation  10/10/2016  Barry Raymond is an 51 y.o. male.   Chief Complaint  Patient presents with  . Follow-up    Mid-Back Lipoma     HPI: F/u for two lipoma soft tissue back. MRI personal review there 2 large lipomas no evidence of sarcoma. Scars with the patient in detail about findings. He still wishes to have this lipoma was removed. Given the complexity and the size we will perform this in the operating room under general anesthetic Discussed with the patient in detail about the procedure. Risks, benefits and possible complications including but not limited to: Bleeding, infection, chronic pain, the need for re-intervention. The chance of seroma formation and wound infection. I encouraged him to stop smoking.  Past Medical History:  Diagnosis Date  . COPD (chronic obstructive pulmonary disease) ([place])   . Lipoma of back    Noted on xray today  . Umbilical hernia     History reviewed. No pertinent surgical history.  History reviewed. No pertinent family history.  Social History:  reports that he has been smoking Cigarettes.  He has a 4.00 pack-year smoking history. He has never used smokeless tobacco. He reports that he drinks alcohol. He reports that he does not use drugs.  Allergies: No Known Allergies  Medications reviewed.     ROS Full ROS performed and is otherwise negative other than what is stated in the HPI    BP 119/78   Pulse 80   Temp 98 F (36.7 C) (Oral)   Ht 5\' 8"  (1.727 m)   Wt 120.7 kg (266 lb)   BMI 40.45 kg/m   Physical Exam  Constitutional: He is oriented to person, place, and time and well-developed, well-nourished, and in no distress. No distress.  Eyes: Right eye exhibits no discharge. Left eye exhibits no discharge. No scleral icterus.  Neck: Neck supple. No tracheal deviation present. No thyromegaly present.  Cardiovascular: Normal rate and normal heart sounds.   Pulmonary/Chest: Effort normal. No respiratory distress. He  has no wheezes. He has no rales.  Abdominal: Soft. He exhibits no distension. There is no tenderness. There is no rebound and no guarding.  Musculoskeletal: Normal range of motion. He exhibits no edema or deformity.  Lymphadenopathy:    He has no cervical adenopathy.  Neurological: He is alert and oriented to person, place, and time. Gait normal. GCS score is 15.  Skin: Skin is warm and dry.  There are two large low thoracic soft tissue masses, mobile and non tender  Psychiatric: Mood, memory, affect and judgment normal.  Nursing note and vitals reviewed.     No results found for this or any previous visit (from the past 48 hour(s)). No results found.  Assessment/Plan: Large symptomatic lipomas in need for excision. We'll schedule for excision in the OR under general anesthetic and prone position Caroleen Hamman, MD [address] Surgeon

## 2016-11-05 NOTE — Interval H&P Note (Signed)
History and Physical Interval Note:  11/05/2016 8:23 AM  Barry Raymond  has presented today for surgery, with the diagnosis of lipomas  The various methods of treatment have been discussed with the patient and family. After consideration of risks, benefits and other options for treatment, the patient has consented to  Procedure(s) with comments: EXCISION LIPOMA BACK x 2 (N/A) - PRONE as a surgical intervention .  The patient's history has been reviewed, patient examined, no change in status, stable for surgery.  I have reviewed the patient's chart and labs.  Questions were answered to the patient's satisfaction.     [place]

## 2016-11-05 NOTE — Anesthesia Procedure Notes (Signed)
Procedure Name: Intubation Date/Time: 11/05/2016 9:15 AM Performed by: Allean Found Pre-anesthesia Checklist: Patient identified, Emergency Drugs available, Suction available, Patient being monitored and Timeout performed Patient Re-evaluated:Patient Re-evaluated prior to inductionOxygen Delivery Method: Circle system utilized Preoxygenation: Pre-oxygenation with 100% oxygen Intubation Type: IV induction Ventilation: Mask ventilation without difficulty Laryngoscope Size: McGraph and 4 Grade View: Grade I Tube type: Oral Tube size: 7.5 mm Number of attempts: 1 Airway Equipment and Method: Stylet Placement Confirmation: ETT inserted through vocal cords under direct vision,  positive ETCO2 and breath sounds checked- equal and bilateral Secured at: 23 cm Tube secured with: Tape Dental Injury: Teeth and Oropharynx as per pre-operative assessment

## 2016-11-06 ENCOUNTER — Encounter: Payer: Self-pay | Admitting: Cardiothoracic Surgery

## 2016-11-06 LAB — CBC
HEMATOCRIT: 32.9 % — AB (ref 40.0–52.0)
Hemoglobin: 11.8 g/dL — ABNORMAL LOW (ref 13.0–18.0)
MCH: 32.4 pg (ref 26.0–34.0)
MCHC: 36.1 g/dL — AB (ref 32.0–36.0)
MCV: 90 fL (ref 80.0–100.0)
Platelets: 161 10*3/uL (ref 150–440)
RBC: 3.65 MIL/uL — ABNORMAL LOW (ref 4.40–5.90)
RDW: 14 % (ref 11.5–14.5)
WBC: 7.6 10*3/uL (ref 3.8–10.6)

## 2016-11-06 LAB — BASIC METABOLIC PANEL
ANION GAP: 5 (ref 5–15)
BUN: 17 mg/dL (ref 6–20)
CALCIUM: 8.3 mg/dL — AB (ref 8.9–10.3)
CO2: 26 mmol/L (ref 22–32)
CREATININE: 1.38 mg/dL — AB (ref 0.61–1.24)
Chloride: 107 mmol/L (ref 101–111)
GFR calc Af Amer: >60 mL/min (ref >60)
GFR calc non Af Amer: 59 mL/min — ABNORMAL LOW (ref >60)
GLUCOSE: 125 mg/dL — AB (ref 65–99)
Potassium: 4.1 mmol/L (ref 3.5–5.1)
Sodium: 138 mmol/L (ref 135–145)

## 2016-11-06 LAB — HIV ANTIBODY (ROUTINE TESTING W REFLEX): HIV SCREEN 4TH GENERATION: NONREACTIVE

## 2016-11-06 LAB — PROTIME-INR
INR: 0.97
Prothrombin Time: 12.9 seconds (ref 11.4–15.2)

## 2016-11-06 LAB — APTT: aPTT: 27 seconds (ref 24–36)

## 2016-11-06 NOTE — Consult Note (Signed)
Patient seen at surgeon's request. He is endorsing bilateral eye redness.   He had 2 general anesthetics yesterday. He was prone for both general anesthetics and was intubated for both procedures.  He is a smoker. He did have a lot of coughing after his first anesthetic which I was present for. I was able to speak with the physician who provided his second anesthetic, Dr. Rosey Bath, who endorsed the same degree of patient coughing after extubation. He was prone for both procedures and his face was protected appropriately with prone foam face cushioning pillow for both procedures. There was no pressure on his eyes during either procedures. His eyes were appropriately secured close for both procedures during the procedures.  His blood pressure was kept in a safe range for both procedures.  He is not endorsing any vision changes.  He has no vision loss upon a gross 4 quadrant vision test. He is not endorsing any eye pain or any other eye symptoms.   At this point I agree with Dr. Dahlia Byes that this patient has some degree of bilateral conjunctival hemorrhage. I believe that this may have been caused by coughing post extubation due to his lungs being more reactive from his smoking history. This also would have been exacerbated by him going prone again for a second procedure if he had conjunctival hemorrhage after his first procedure.  I agree that it should resolve on its own.  Patient and family member were instructed that if his eyes do not get better, if they get worse, or if he has any new concerns or any other eye or vision changes that he should contact a physician immediately.  Patient and family member both voiced understanding.

## 2016-11-06 NOTE — Progress Notes (Signed)
POD # 1 JP 580cc Hb 11.8 AVSS Taking PO  Main issue is sub conjunctival hemorrhage, no obvious visual changes  PE NAD, sitting up eating breakfast Eyes: EOMI, significant bilateral subconjuntival hemorrage, no obvious visual impairment Wound healing well, no expanding hematoma, serosanguinous draiange  A/P DOing well D/W anesthesia in detail about conjunctival hemorrhage and they will evaluate the pt and determine if ophthalmology needs to be involve We will keep one more day since he is concerned about vision changes and to make sure drain output slows down Ordered coags this am No surgical intervention Likely DC inam

## 2016-11-06 NOTE — Addendum Note (Signed)
Addendum  created 11/06/16 1000 by Jashae Wiggs, Precious Haws, MD   Sign clinical note

## 2016-11-07 LAB — BPAM RBC
Blood Product Expiration Date: 201807212359
Blood Product Expiration Date: 201807212359
Unit Type and Rh: 5100
Unit Type and Rh: 5100

## 2016-11-07 LAB — TYPE AND SCREEN
ABO/RH(D): O POS
ANTIBODY SCREEN: NEGATIVE
UNIT DIVISION: 0
UNIT DIVISION: 0

## 2016-11-07 LAB — SURGICAL PATHOLOGY

## 2016-11-07 LAB — PREPARE RBC (CROSSMATCH)

## 2016-11-07 NOTE — Progress Notes (Signed)
Pt A and O x 4. VSS. Pt tolerating diet well. No complaints of pain or nausea. IV removed intact, prescriptions given. Dressing changed at [place] site. Pt voiced understanding of discharge instructions with no further questions. Education given related to emptying JP and changing dressing. Pt discharged via wheelchair with axillary.

## 2016-11-07 NOTE — Discharge Summary (Signed)
Patient ID: Barry Raymond MRN: [id number] DOB/AGE: [birth year] 49 y.o.  Admit date: 11/05/2016 Discharge date: 11/07/2016   Discharge Diagnoses:  Active Problems:   Lipoma of lower back   Lipoma of back   S/P excision of lipoma   Hematoma   Procedures:  Excision of giant Lipoma back x 2 Re- exploration w evacuation of hematoma for post op bleeding  Hospital Course: 51 year old male underwent an excision of 2 lipomas of the VAC one of them was giant. Initially did well but in the postoperative period developed a large hematoma. Hemodynamics and hemoglobin remained stable and he was taken back to the operating for exploration and evacuation of clot and hemostasis. There was actually perforating vessel that was actively bleeding. I our string was placed and the wound was closed. And he was kept in the hospital for tonight for observation. He did well his drain output decreased and hemodynamically he remained stable. He did develop some subconjunctival hemorrhages that anesthesia assessed and likely was related to the anesthetic and the prone position. This results as well. At the time of discharge he was ambulating, tolerated regular diet, his vital signs were stable and there was decrease the output of drain. Also the quality was more serous than sanguinous. His physical exam at time of discharge show a male in no acute distress. Awake and alert. Abdomen: Soft nontender. Wound healing well with no evidence of infection. There was some serosanguineous drainage from the [place]. No evidence of expanding hematoma. Condition of the patient at time of discharge stable. He will be sent home with the drain.  Disposition: 01-Home or Self Care  Discharge Instructions    Call MD for:  difficulty breathing, headache or visual disturbances    Complete by:  As directed    Call MD for:  extreme fatigue    Complete by:  As directed    Call MD for:  hives    Complete by:  As directed    Call MD for:  persistant  dizziness or light-headedness    Complete by:  As directed    Call MD for:  persistant nausea and vomiting    Complete by:  As directed    Call MD for:  redness, tenderness, or signs of infection (pain, swelling, redness, odor or green/yellow discharge around incision site)    Complete by:  As directed    Call MD for:  severe uncontrolled pain    Complete by:  As directed    Call MD for:  temperature >100.4    Complete by:  As directed    Change dressing (specify)    Complete by:  As directed    Dressing change: change drain sponge daily. May remove dressing tomorrow and Please teach pt to empty and record JP BID   Diet - low sodium heart healthy    Complete by:  As directed    Discharge instructions    Complete by:  As directed    Please teach pt about JP care   Discharge patient    Complete by:  As directed    Discharge disposition:  01-Home or Self Care   Discharge patient date:  11/07/2016   Increase activity slowly    Complete by:  As directed      Allergies as of 11/07/2016      Reactions   Chlorhexidine Gluconate Itching   Patient experienced uncontrollable itching after using the chg wipes prior to surgery. Was relieved after wiping away the chg with  aloe wipes.       Medication List    STOP taking these medications   albuterol 108 (90 Base) MCG/ACT inhaler Commonly known as:  PROVENTIL HFA;VENTOLIN HFA     TAKE these medications   HYDROcodone-acetaminophen 5-325 MG tablet Commonly known as:  NORCO/VICODIN Take 1-2 tablets by mouth every 6 (six) hours as needed for moderate pain.      Follow-up Information    [place], [place], MD Follow up on 11/13/2016.   Specialty:  General Surgery Why:  Dr. Dahlia Byes, Wednesday, July 11 at Left message with office. Contact information: [place] [address] [phone]            Caroleen Hamman, MD FACS

## 2016-11-07 NOTE — Care Management (Signed)
Patient admitted with Excision of giant lipoma.  Patient self pay and does not have PCP.  Patient states that he is not on any home medications.  Patient to discharge on pain medication only.  Patient provided with application for [facility] and Medication Management. RNCM signing off.

## 2016-11-08 ENCOUNTER — Encounter: Payer: Self-pay | Admitting: Surgery

## 2016-11-08 ENCOUNTER — Telehealth: Payer: Self-pay

## 2016-11-08 NOTE — Telephone Encounter (Signed)
Post-op call made to patient at this time. Left a message for patient to call back with any question or concerns that he may have that we can assist with to help him recover. Patient currently has a drain in place.

## 2016-11-13 ENCOUNTER — Ambulatory Visit (INDEPENDENT_AMBULATORY_CARE_PROVIDER_SITE_OTHER): Payer: Self-pay | Admitting: Surgery

## 2016-11-13 ENCOUNTER — Encounter: Payer: Self-pay | Admitting: Surgery

## 2016-11-13 VITALS — BP 144/84 | HR 86 | Temp 98.4°F | Ht 68.0 in | Wt 261.4 lb

## 2016-11-13 DIAGNOSIS — Z09 Encounter for follow-up examination after completed treatment for conditions other than malignant neoplasm: Secondary | ICD-10-CM

## 2016-11-13 MED ORDER — OXYCODONE-ACETAMINOPHEN 5-325 MG PO TABS: 1.0000 | ORAL_TABLET | Freq: Four times a day (QID) | ORAL | 0 refills | Status: DC | PRN

## 2016-11-13 MED ORDER — GABAPENTIN 300 MG PO CAPS: 300.0000 mg | ORAL_CAPSULE | Freq: Three times a day (TID) | ORAL | 0 refills | Status: DC

## 2016-11-13 NOTE — Patient Instructions (Addendum)
We will see you back in 1 week for possible drain pull and for some of your sutures to be removed. Please see appointment below.  Continue to record your drain amounts twice daily and place on new drain sheet given today.  Please call with any questions or concerns prior to your next scheduled appointment.  You may not return to work until you are seen in the office next week.

## 2016-11-13 NOTE — Progress Notes (Signed)
S/p Gigantic lipoma excision  X 2 complicated by post op bleed Doing well Path d/w pt Output from drain is about 20cc day but we stripped a few clots today and it re accumlulated 10-15 cc after that HE still having some pain. No fevers or chills  PE NAD Wound healing well, staples in place. Serous fluid on JP. No infection or undrained pockets. There is an indentention on the subq tissue as expected given its massive size  A/p Doing well Script for percocet Add gabapentin Recommend weaning off narcotics  Ice pack and NSAIDS d/w pt in detail F/U next week hopefully we will pull the drain and remove staples.

## 2016-11-20 ENCOUNTER — Ambulatory Visit (INDEPENDENT_AMBULATORY_CARE_PROVIDER_SITE_OTHER): Payer: Self-pay | Admitting: Surgery

## 2016-11-20 ENCOUNTER — Encounter: Payer: Self-pay | Admitting: Surgery

## 2016-11-20 VITALS — BP 112/76 | HR 85 | Temp 98.6°F | Ht 68.0 in | Wt 261.4 lb

## 2016-11-20 DIAGNOSIS — Z09 Encounter for follow-up examination after completed treatment for conditions other than malignant neoplasm: Secondary | ICD-10-CM

## 2016-11-20 NOTE — Patient Instructions (Signed)
We have removed your drain today. Please place gauze over the drain site until it closes completely.  We have removed staples today and placed steri strips. These will begin to fall off in 7-10 days. You may shower however be sure to pat dry the steri strips.  Please do not do any strenuous activities or submerge in a pool or hot tub. Please call our office if your see an increase in drainage, fever.  Please take Ibuprofen for the discomfort.   Please see your follow up appointment listed below.

## 2016-11-20 NOTE — Progress Notes (Signed)
11/20/2016  HPI: Patient is s/p excision of large back lipomas with Dr. Dahlia Byes on 7/3, complicated by return to OR on same day due to developing hematoma.  He presents for follow up.  He has had low volume of JP output per day, and it's remained serosanguinous.  He has noted some swelling on the right side of the wound, more around the drain itself, but denies any drainage or leakage around the drain and denies any purulent drainage.  Vital signs: BP 112/76   Pulse 85   Temp 98.6 F (37 C) (Oral)   Ht 5\' 8"  (1.727 m)   Wt 118.6 kg (261 lb 6.4 oz)   BMI 39.75 kg/m    Physical Exam: Constitutional: No acute distress Skin:  Patient has a large mid back transverse incision as well as a smaller upper back incision, both with staples.  JP drain with only low volume of serosanguinous fluid.  Over the large incision, the mid staples have sunken under skin and scab.  All staples were removed.  Q-tip was used to probe wound to see if there is any occult fluid builtup, but there was no significant drainage. There is no evidence of infection or erythema.  Assessment/Plan: 51 -year-old male status post excision of 2 lipomas of the back complicated by hematoma.  -All staples were removed without complications. Steri-Strips placed with no issues. JP drain removed with no complications. -Given the mild swelling particularly on the right side of the large incision near the JP drain, we'll have the patient come back next week so we can reassess his wound. Have given him strict return precautions particularly with worsening swelling or tenderness of the wounds, purulent drainage, opening of the incisions, fevers, chills, or other concerns.   Melvyn Neth, [place]

## 2016-11-27 ENCOUNTER — Ambulatory Visit (INDEPENDENT_AMBULATORY_CARE_PROVIDER_SITE_OTHER): Payer: Self-pay | Admitting: Surgery

## 2016-11-27 ENCOUNTER — Encounter: Payer: Self-pay | Admitting: Surgery

## 2016-11-27 VITALS — BP 130/80 | HR 73 | Temp 98.6°F | Ht 68.0 in | Wt 258.6 lb

## 2016-11-27 DIAGNOSIS — Z09 Encounter for follow-up examination after completed treatment for conditions other than malignant neoplasm: Secondary | ICD-10-CM

## 2016-11-27 NOTE — Patient Instructions (Addendum)
You may try some Benadryl for itching. The dose for you 50mg  every 8 hours as needed.  We will see you back in 3 weeks as scheduled.

## 2016-11-28 NOTE — Progress Notes (Signed)
S/p lipoma Drain removed Doing well  PEN: wound healing well, in the center of the wound there is a 4 mm opening, no evidence of infection, seroma or hematoma Bandaid placed  A/P Doing well RTC prn Daily bandaid

## 2016-12-19 ENCOUNTER — Telehealth: Payer: Self-pay | Admitting: General Practice

## 2016-12-19 ENCOUNTER — Encounter: Payer: Self-pay | Admitting: Surgery

## 2016-12-19 NOTE — Telephone Encounter (Signed)
Unable to leave message due to patient has no voicemail setup please rescheduled no show appointment from 12/19/16.
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
## 2018-05-22 IMAGING — CT CT ANGIO CHEST-ABD-PELV FOR DISSECTION W/ AND WO/W CM
2 of 7 series · 12 of 36 positions shown, 17 images · IV contrast (APPLIED)
Comparison: Chest radiographs of same day.

CLINICAL DATA: Chest pain, shortness of breath.

EXAM:
CT ANGIOGRAPHY CHEST, ABDOMEN AND PELVIS
TECHNIQUE: Multidetector CT imaging through the chest, abdomen and pelvis was
performed using the standard protocol during bolus administration of
intravenous contrast. Multiplanar reconstructed images and MIPs were
obtained and reviewed to evaluate the vascular anatomy.
CONTRAST:  100 mL of Isovue 370 intravenously.

[Series 5: axial arterial · axial · arterial · 0.85mm/px · z∈[-704,-89]mm · 11 of 231 slices shown, 15 images]
[im 13/231  mediastinal]
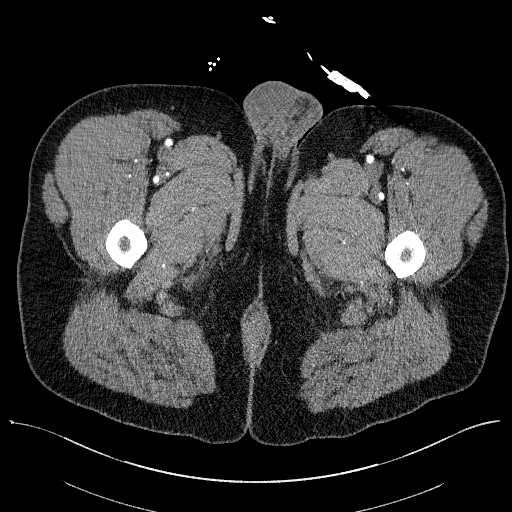
[im 13/231  bone]
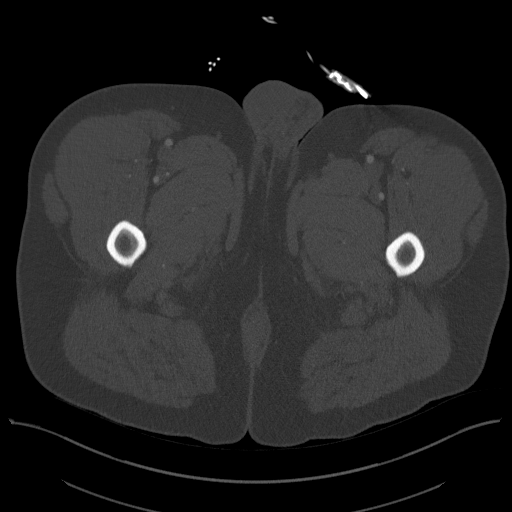
[im 39/231  mediastinal]
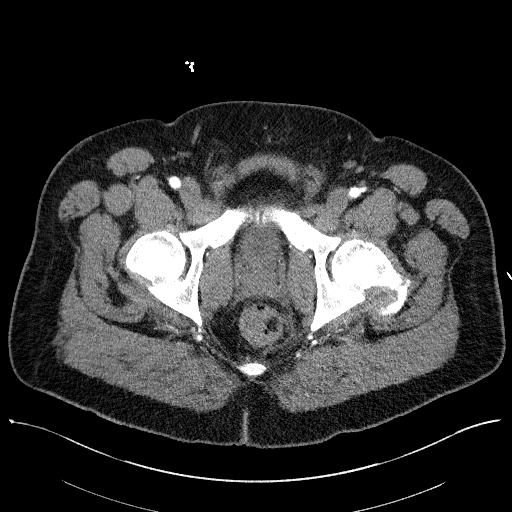
[im 64/231  mediastinal]
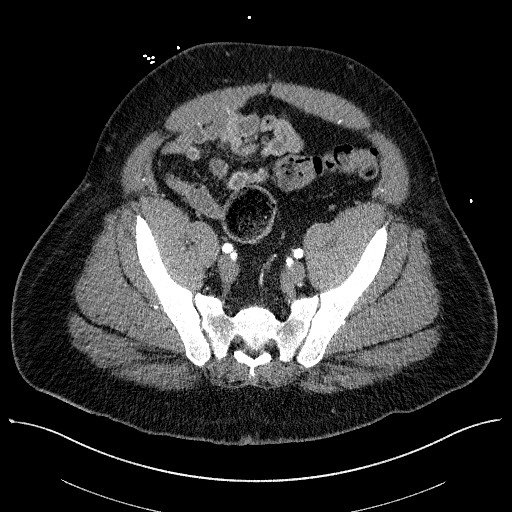
[im 90/231  mediastinal]
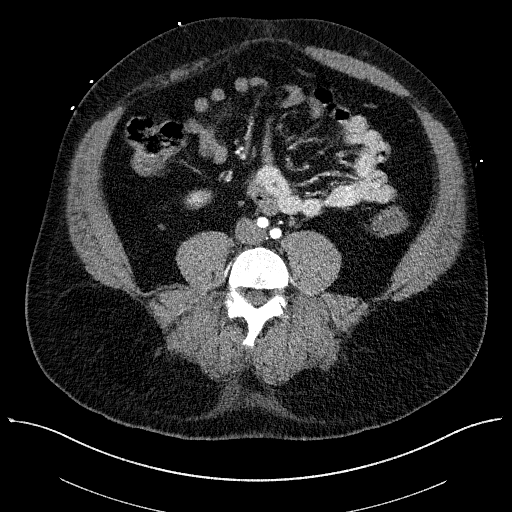
[im 116/231  mediastinal]
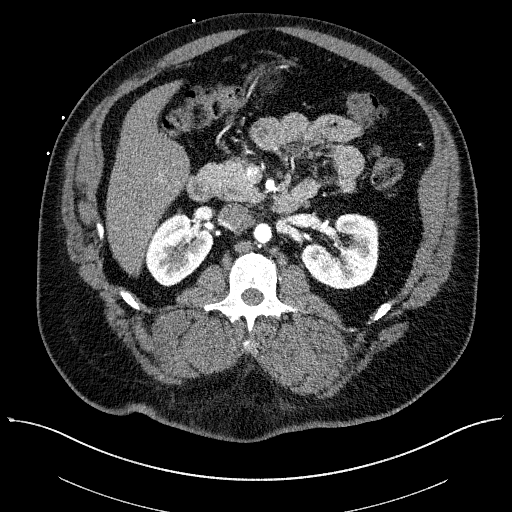
[im 141/231  mediastinal]
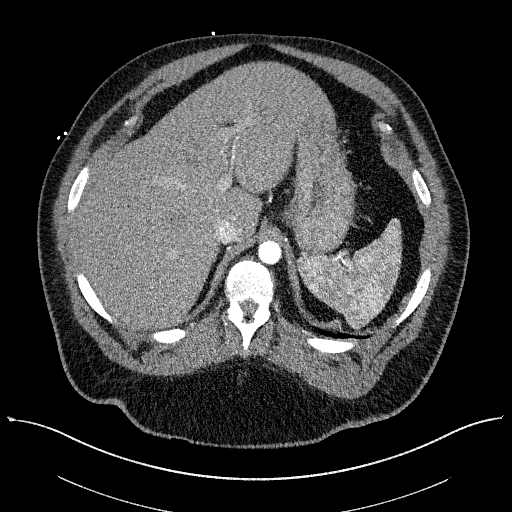
[im 167/231  mediastinal]
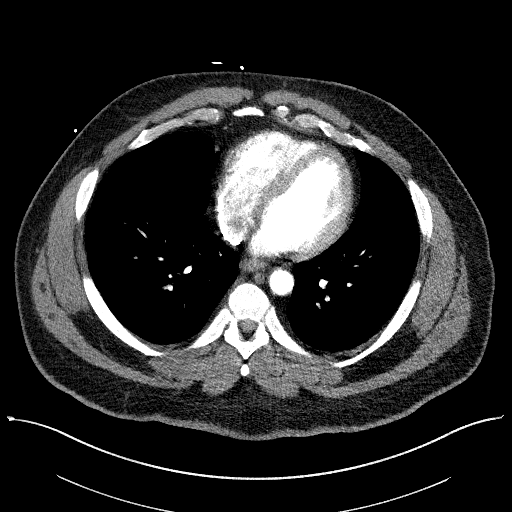
[im 179/231  lung]
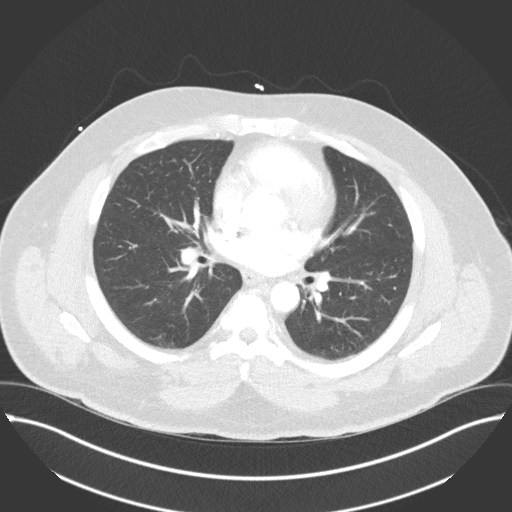
[im 192/231  mediastinal]
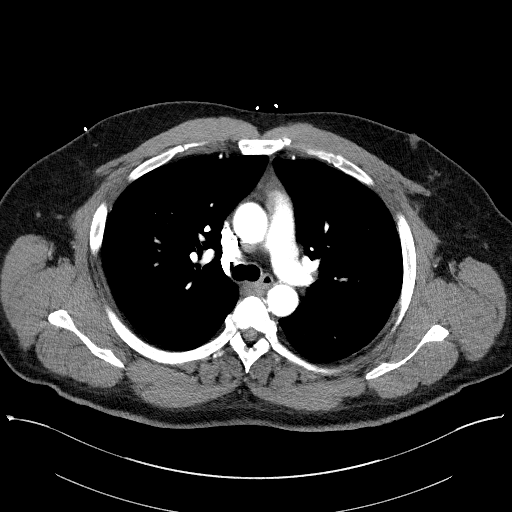
[im 192/231  lung]
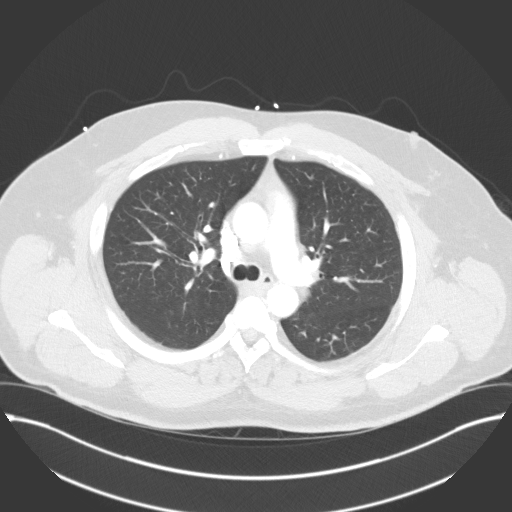
[im 205/231  lung]
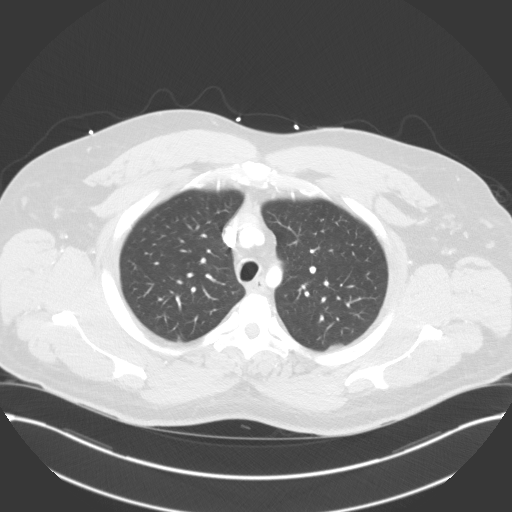
[im 218/231  mediastinal]
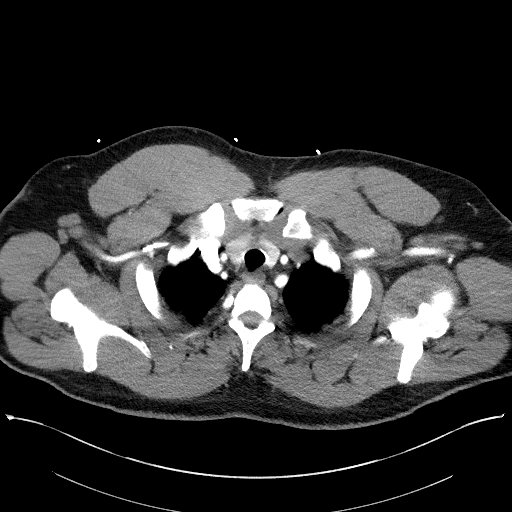
[im 218/231  lung]
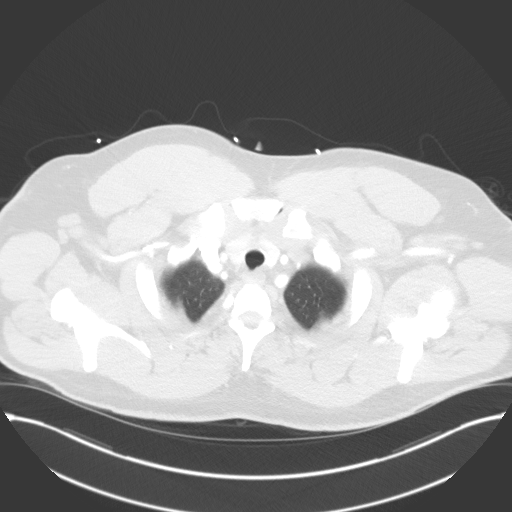
[im 218/231  bone]
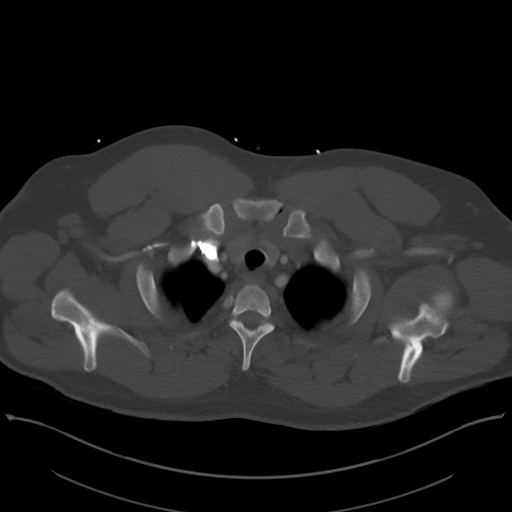

[Series 7: coronals · coronal · 1.09mm/px · 1 of 217 slices shown, 2 images]
[im 109/217  mediastinal]
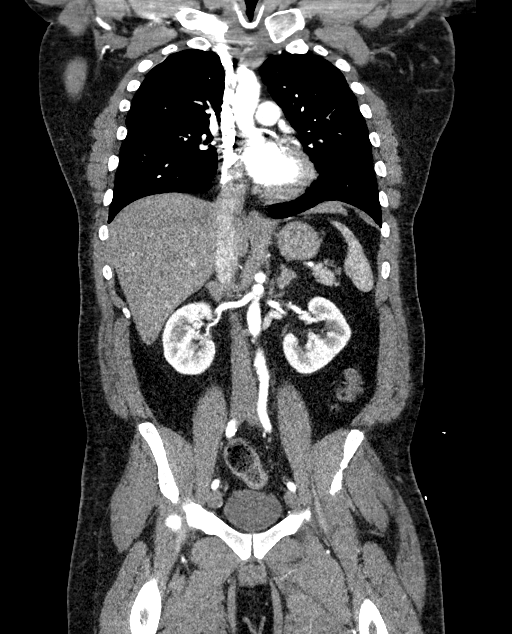
[im 109/217  bone]
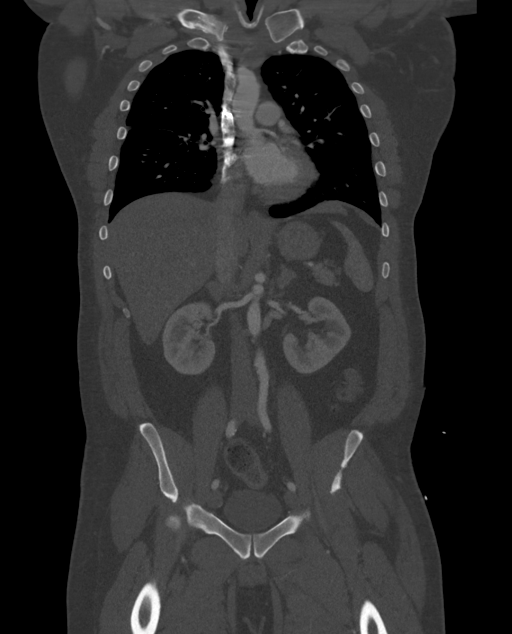

[12 of 36 positions shown; findings below may reference images not displayed]

FINDINGS: CTA CHEST FINDINGS

Cardiovascular: Preferential opacification of the thoracic aorta. No
evidence of thoracic aortic aneurysm or dissection. Normal heart
size. No pericardial effusion.

Mediastinum/Nodes: No enlarged mediastinal, hilar, or axillary lymph
nodes. Thyroid gland, trachea, and esophagus demonstrate no
significant findings.

Lungs/Pleura: Lungs are clear. No pleural effusion or pneumothorax.

Musculoskeletal: No chest wall abnormality. No acute or significant
osseous findings.

Review of the MIP images confirms the above findings.

CTA ABDOMEN AND PELVIS FINDINGS

VASCULAR

Aorta: Normal caliber aorta without aneurysm, dissection, vasculitis
or significant stenosis.

Celiac: Patent without evidence of aneurysm, dissection, vasculitis
or significant stenosis.

SMA: Patent without evidence of aneurysm, dissection, vasculitis or
significant stenosis.

Renals: Both renal arteries are patent without evidence of aneurysm,
dissection, vasculitis, fibromuscular dysplasia or significant
stenosis.

IMA: Patent without evidence of aneurysm, dissection, vasculitis or
significant stenosis.

Inflow: Patent without evidence of aneurysm, dissection, vasculitis
or significant stenosis.

Review of the MIP images confirms the above findings.

NON-VASCULAR

Hepatobiliary: No focal liver abnormality is seen. No gallstones,
gallbladder wall thickening, or biliary dilatation.

Pancreas: Unremarkable. No pancreatic ductal dilatation or
surrounding inflammatory changes.

Spleen: Normal in size without focal abnormality.

Adrenals/Urinary Tract: Adrenal glands are unremarkable. Kidneys are
normal, without renal calculi, focal lesion, or hydronephrosis.
Bladder is unremarkable.

Stomach/Bowel: Stomach is within normal limits. Appendix appears
normal. No evidence of bowel wall thickening, distention, or
inflammatory changes.

Lymphatic: No significant vascular findings are present. No enlarged
abdominal or pelvic lymph nodes.

Reproductive: Prostate is unremarkable.

Other: Fat containing periumbilical hernia is noted. No abnormal
fluid collection is noted.

Musculoskeletal: No acute or significant osseous findings.

Review of the MIP images confirms the above findings.
IMPRESSION: No evidence of thoracic or abdominal aortic dissection or aneurysm.

Mesenteric and renal arteries are widely patent without stenosis.

Moderate size fat containing periumbilical hernia is noted.

No other abnormality seen in the chest, abdomen or pelvis.

## 2018-06-04 ENCOUNTER — Other Ambulatory Visit: Payer: Self-pay

## 2018-06-04 ENCOUNTER — Encounter: Payer: Self-pay | Admitting: Emergency Medicine

## 2018-06-04 ENCOUNTER — Emergency Department: Payer: Self-pay

## 2018-06-04 ENCOUNTER — Emergency Department
Admission: EM | Admit: 2018-06-04 | Discharge: 2018-06-04 | Disposition: A | Discharge status: Home / Self Care | Payer: Self-pay | Attending: Emergency Medicine | Admitting: Emergency Medicine

## 2018-06-04 DIAGNOSIS — R05 Cough: Secondary | ICD-10-CM | POA: Insufficient documentation

## 2018-06-04 DIAGNOSIS — F17210 Nicotine dependence, cigarettes, uncomplicated: Secondary | ICD-10-CM | POA: Insufficient documentation

## 2018-06-04 DIAGNOSIS — J40 Bronchitis, not specified as acute or chronic: Secondary | ICD-10-CM | POA: Insufficient documentation

## 2018-06-04 DIAGNOSIS — R0602 Shortness of breath: Secondary | ICD-10-CM | POA: Insufficient documentation

## 2018-06-04 DIAGNOSIS — R0789 Other chest pain: Secondary | ICD-10-CM | POA: Insufficient documentation

## 2018-06-04 DIAGNOSIS — J449 Chronic obstructive pulmonary disease, unspecified: Secondary | ICD-10-CM | POA: Insufficient documentation

## 2018-06-04 LAB — BASIC METABOLIC PANEL
Anion gap: 8 (ref 5–15)
BUN: 10 mg/dL (ref 6–20)
CHLORIDE: 105 mmol/L (ref 98–111)
CO2: 23 mmol/L (ref 22–32)
CREATININE: 1.04 mg/dL (ref 0.61–1.24)
Calcium: 8.8 mg/dL — ABNORMAL LOW (ref 8.9–10.3)
GFR calc non Af Amer: >60 mL/min (ref >60)
GLUCOSE: 113 mg/dL — AB (ref 70–99)
Potassium: 3.9 mmol/L (ref 3.5–5.1)
Sodium: 136 mmol/L (ref 135–145)

## 2018-06-04 LAB — CBC
HEMATOCRIT: 44 % (ref 39.0–52.0)
Hemoglobin: 14.8 g/dL (ref 13.0–17.0)
MCH: 29.7 pg (ref 26.0–34.0)
MCHC: 33.6 g/dL (ref 30.0–36.0)
MCV: 88.2 fL (ref 80.0–100.0)
NRBC: 0 % (ref 0.0–0.2)
Platelets: 203 10*3/uL (ref 150–400)
RBC: 4.99 MIL/uL (ref 4.22–5.81)
RDW: 13.2 % (ref 11.5–15.5)
WBC: 8.3 10*3/uL (ref 4.0–10.5)

## 2018-06-04 LAB — TROPONIN I: Troponin I: <0.03 ng/mL (ref <0.03)

## 2018-06-04 MED ORDER — IPRATROPIUM-ALBUTEROL 0.5-2.5 (3) MG/3ML IN SOLN
3.0000 mL | Freq: Once | RESPIRATORY_TRACT | Status: AC
  Administered 2018-06-04: 3 mL via RESPIRATORY_TRACT
  Filled 2018-06-04: qty 3

## 2018-06-04 MED ORDER — PREDNISONE 20 MG PO TABS: 20.0000 mg | ORAL_TABLET | Freq: Every day | ORAL | 0 refills | Status: AC

## 2018-06-04 MED ORDER — AZITHROMYCIN 250 MG PO TABS: ORAL_TABLET | ORAL | 0 refills | Status: AC

## 2018-06-04 MED ORDER — ALBUTEROL SULFATE HFA 108 (90 BASE) MCG/ACT IN AERS: 2.0000 | INHALATION_SPRAY | Freq: Four times a day (QID) | RESPIRATORY_TRACT | 2 refills | Status: DC | PRN

## 2018-06-04 MED ORDER — PREDNISONE 20 MG PO TABS
60.0000 mg | ORAL_TABLET | Freq: Once | ORAL | Status: AC
  Administered 2018-06-04: 60 mg via ORAL
  Filled 2018-06-04: qty 3

## 2018-06-04 MED ORDER — METHYLPREDNISOLONE SODIUM SUCC 125 MG IJ SOLR
125.0000 mg | Freq: Once | INTRAMUSCULAR | Status: AC
  Administered 2018-06-04: 125 mg via INTRAVENOUS
  Filled 2018-06-04: qty 2

## 2018-06-04 MED ORDER — ALBUTEROL SULFATE (2.5 MG/3ML) 0.083% IN NEBU
5.0000 mg | INHALATION_SOLUTION | Freq: Once | RESPIRATORY_TRACT | Status: AC
  Administered 2018-06-04: 5 mg via RESPIRATORY_TRACT

## 2018-06-04 NOTE — Discharge Instructions (Signed)
Take the prednisone once a day starting tomorrow.  Take the Zithromax as directed starting tomorrow.  Use the inhaler 2 puffs 4 times a day as we discussed.  You can use inhaler another 2 times today at least.  If you needed you can use it as often as every 4 hours but then he should probably come in the hospital and be rechecked.  Please return if you get more short of breath again get a fever or feel sicker.  Return also if you are not a lot better in 2 or 3 days.

## 2018-06-04 NOTE — ED Notes (Addendum)
Pt to ER via POV ambulatory to desk c/o congestion, wheezing and cough X 3 days. Pt RR even and unlabored. Pt given mask. Nonproductive cough.
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
## 2018-06-04 NOTE — ED Triage Notes (Signed)
Pt taken back to triage. Pt unable to maintain conversation without becoming short of breath. Wheezing heard on auscultation. Pt gesturing to make conversation. Pt asked when symptoms started pt reports 3 days prior. Pt reports mis sternum chest tightness. Pt's room air saturation 90%.

## 2018-08-21 IMAGING — CR DG CHEST 2V
2 series · 2 of 2 positions shown · non-contrast
Comparison: Chest radiograph and CTA of the chest performed
06/11/2016

CLINICAL DATA: Acute onset of left-sided chest pain and shortness
of breath. Initial encounter.

EXAM:
CHEST  2 VIEW

[chest pa]
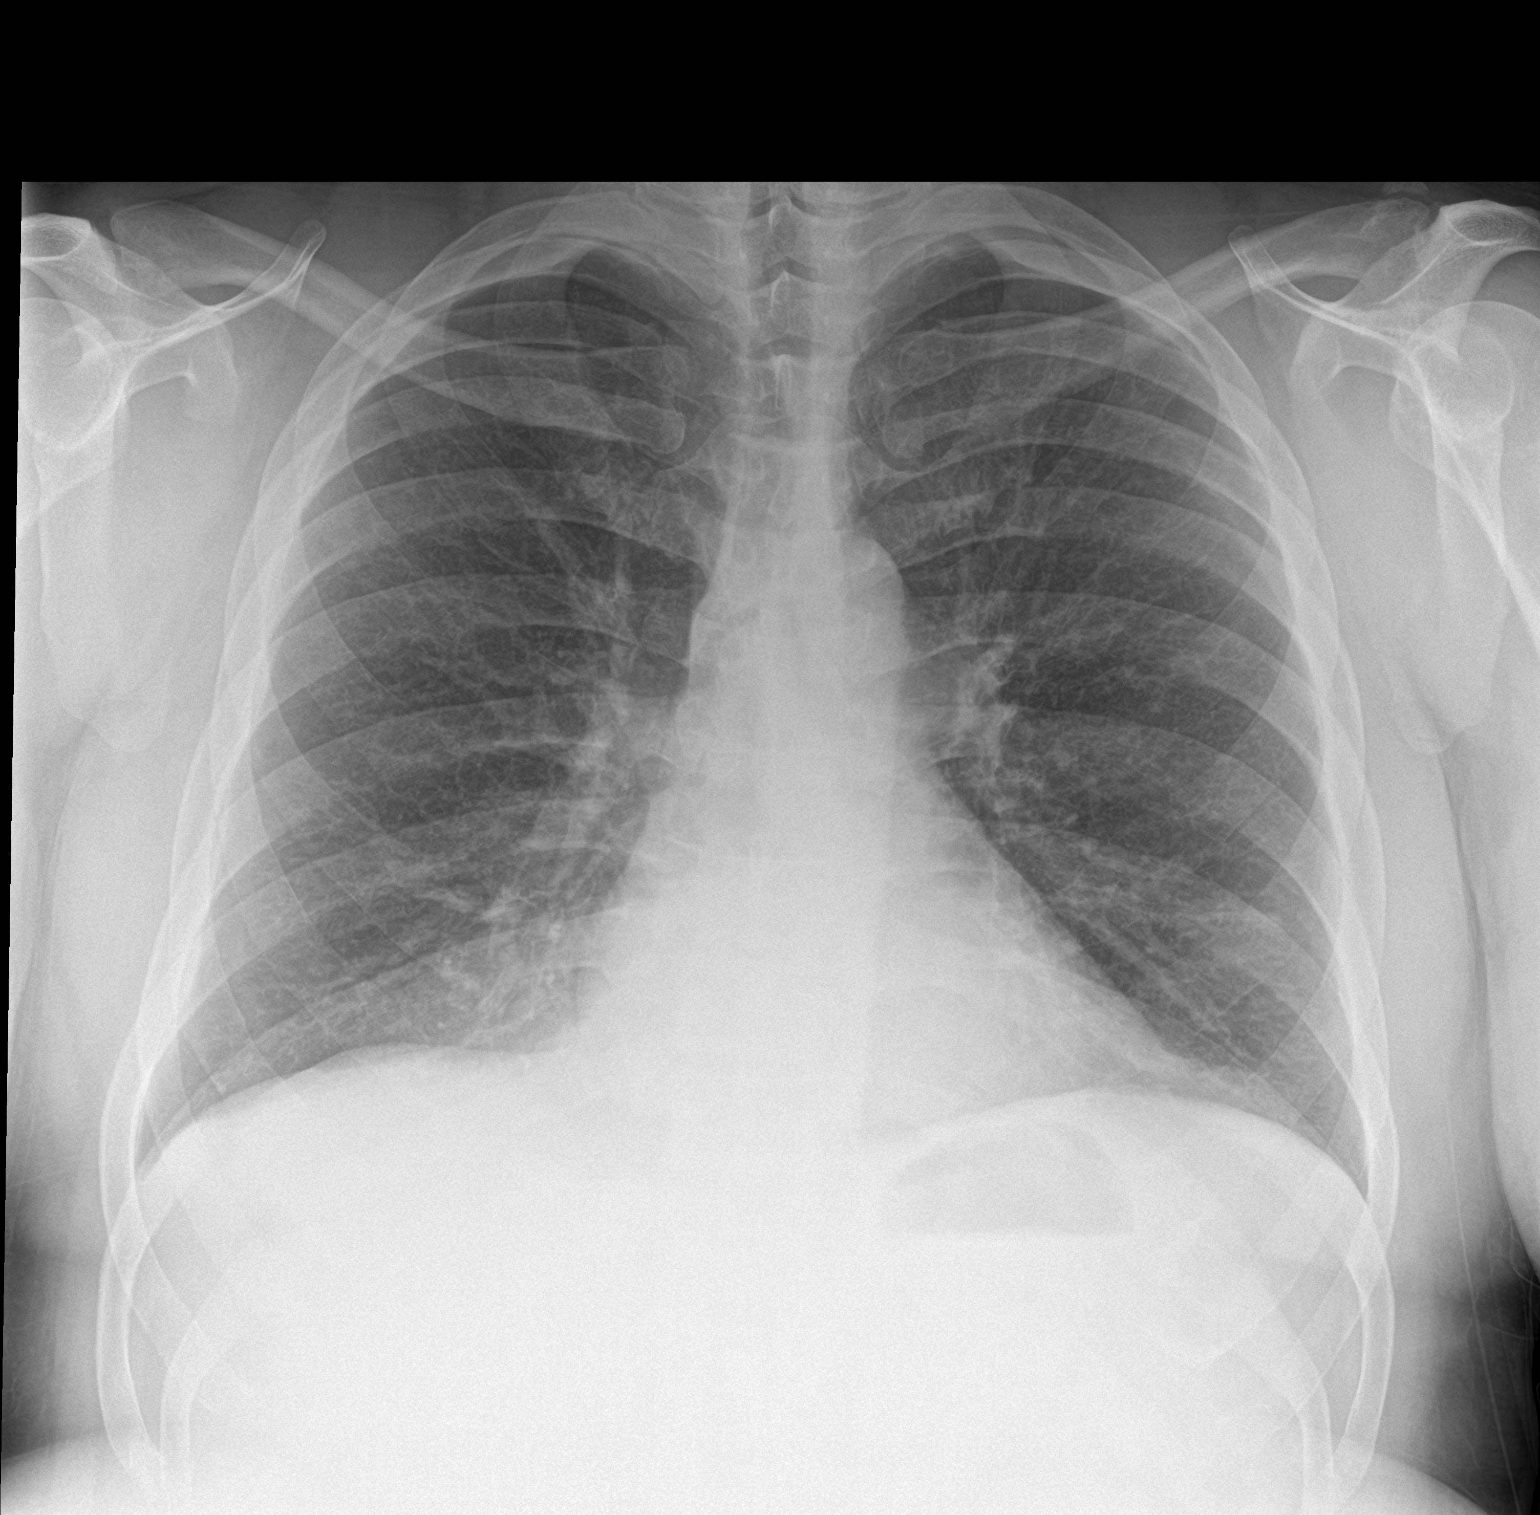

[chest lat]
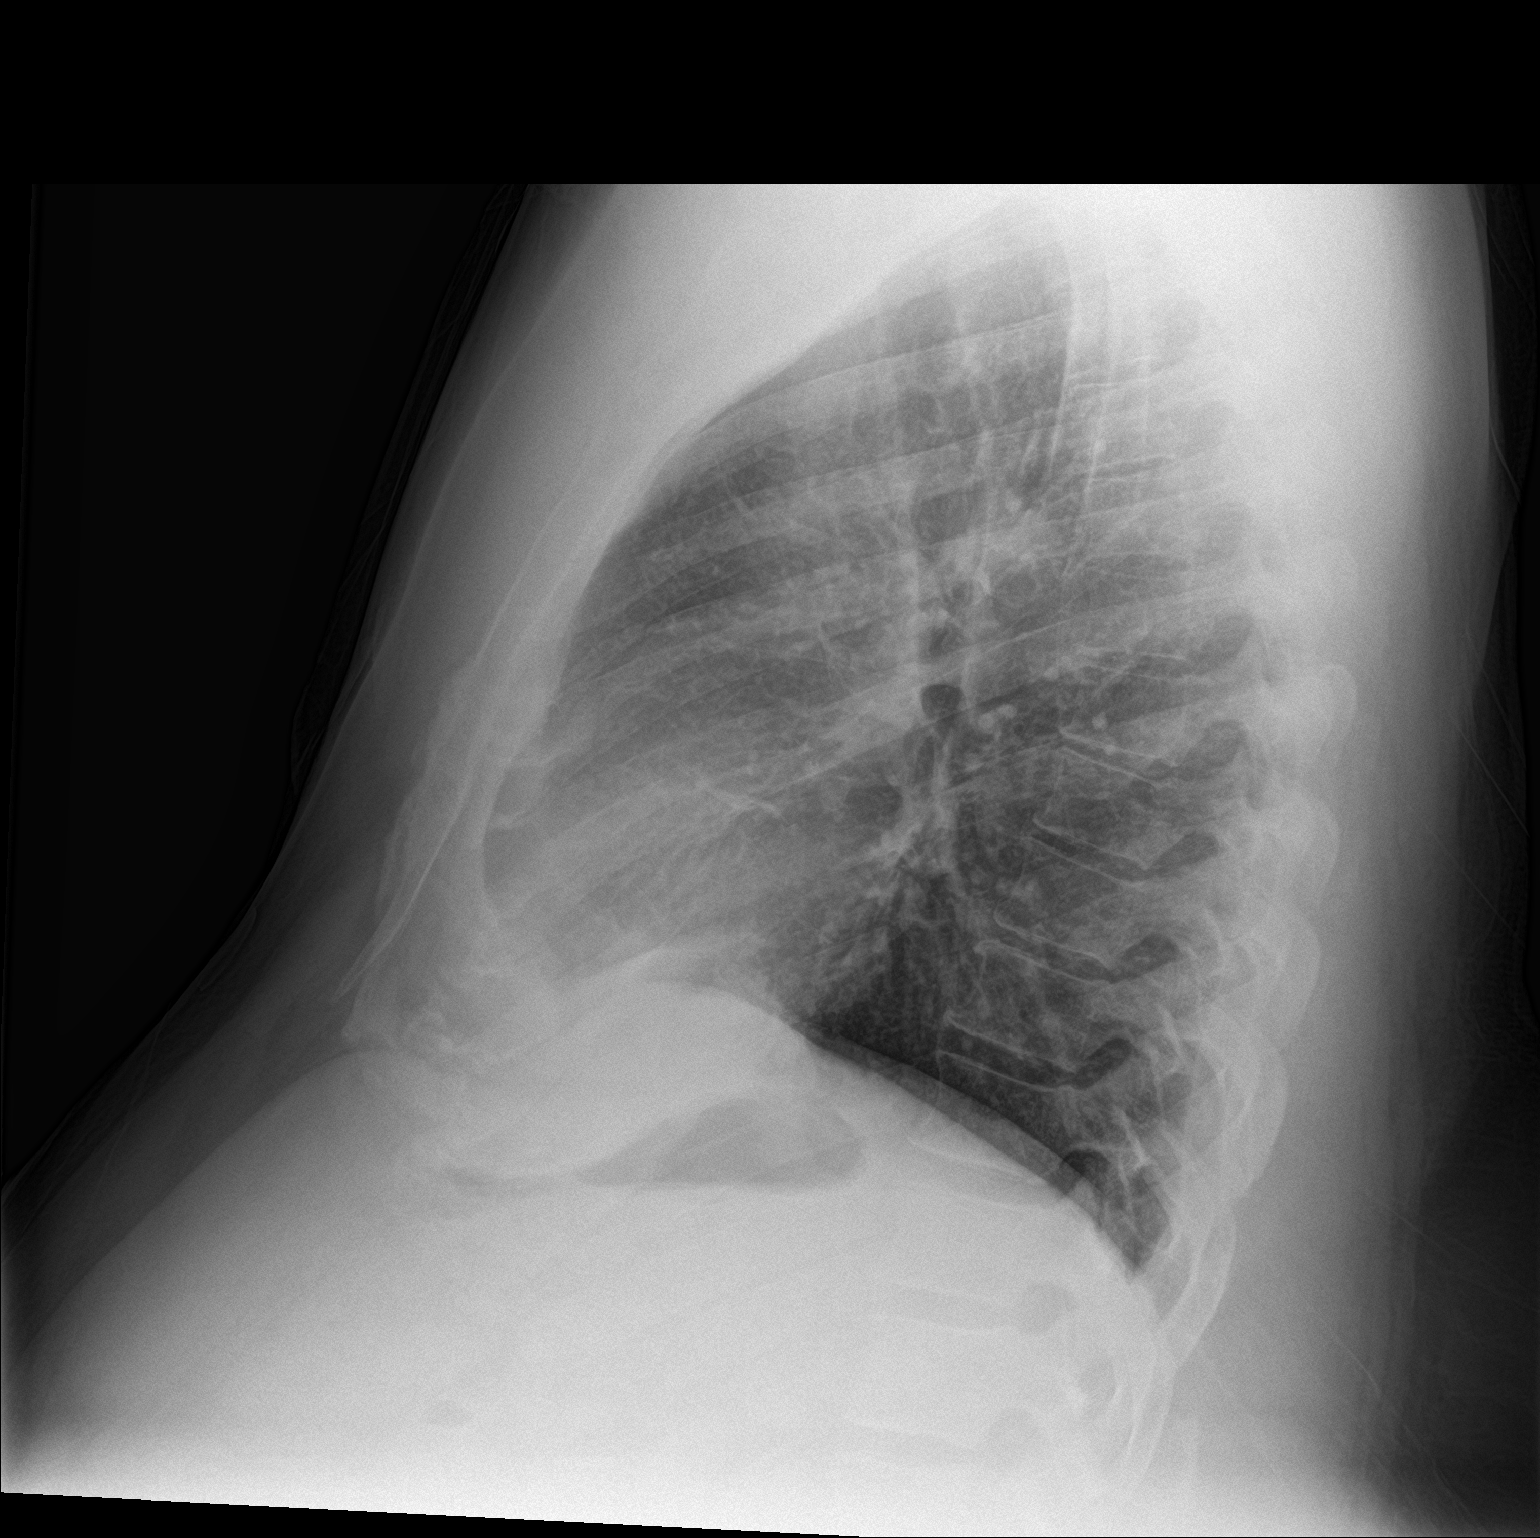

[2 of 2 positions shown; findings below may reference images not displayed]

FINDINGS: The lungs are well-aerated. Mild bibasilar atelectasis is noted.
Peribronchial thickening is seen. There is no evidence of pleural
effusion or pneumothorax.

The heart is normal in size; the mediastinal contour is within
normal limits. No acute osseous abnormalities are seen.
IMPRESSION: Mild bibasilar atelectasis noted.  Peribronchial thickening seen.

## 2019-12-16 ENCOUNTER — Emergency Department: Payer: Self-pay

## 2019-12-16 ENCOUNTER — Emergency Department
Admission: EM | Admit: 2019-12-16 | Discharge: 2019-12-16 | Disposition: A | Discharge status: Home / Self Care | Payer: Self-pay | Attending: Emergency Medicine | Admitting: Emergency Medicine

## 2019-12-16 ENCOUNTER — Other Ambulatory Visit: Payer: Self-pay

## 2019-12-16 ENCOUNTER — Encounter: Payer: Self-pay | Admitting: Emergency Medicine

## 2019-12-16 DIAGNOSIS — M5412 Radiculopathy, cervical region: Secondary | ICD-10-CM | POA: Insufficient documentation

## 2019-12-16 DIAGNOSIS — J449 Chronic obstructive pulmonary disease, unspecified: Secondary | ICD-10-CM | POA: Insufficient documentation

## 2019-12-16 DIAGNOSIS — F17210 Nicotine dependence, cigarettes, uncomplicated: Secondary | ICD-10-CM | POA: Insufficient documentation

## 2019-12-16 MED ORDER — OXYCODONE-ACETAMINOPHEN 5-325 MG PO TABS
1.0000 | ORAL_TABLET | Freq: Once | ORAL | Status: AC
  Administered 2019-12-16: 1 via ORAL
  Filled 2019-12-16: qty 1

## 2019-12-16 MED ORDER — TIZANIDINE HCL 4 MG PO TABS: 4.0000 mg | ORAL_TABLET | Freq: Three times a day (TID) | ORAL | 0 refills | Status: DC

## 2019-12-16 MED ORDER — OXYCODONE-ACETAMINOPHEN 10-325 MG PO TABS: 1.0000 | ORAL_TABLET | Freq: Four times a day (QID) | ORAL | 0 refills | Status: AC | PRN

## 2019-12-16 MED ORDER — NAPROXEN 500 MG PO TABS
500.0000 mg | ORAL_TABLET | Freq: Once | ORAL | Status: AC
  Administered 2019-12-16: 500 mg via ORAL
  Filled 2019-12-16: qty 1

## 2019-12-16 MED ORDER — ORPHENADRINE CITRATE 30 MG/ML IJ SOLN
60.0000 mg | Freq: Two times a day (BID) | INTRAMUSCULAR | Status: DC
  Administered 2019-12-16: 60 mg via INTRAMUSCULAR
  Filled 2019-12-16: qty 2

## 2019-12-16 MED ORDER — NAPROXEN 500 MG PO TABS: 500.0000 mg | ORAL_TABLET | Freq: Two times a day (BID) | ORAL | 0 refills | Status: DC

## 2019-12-16 NOTE — ED Notes (Signed)
See triage note.

## 2019-12-16 NOTE — ED Triage Notes (Signed)
C/O waking with left neck pain radiating down left arm  AAOx3.  Skin warm and dry. NAD

## 2019-12-16 NOTE — Discharge Instructions (Signed)
Please follow up with orthopedics.  Do not drive while taking pain medication or muscle relaxer.  Return to the ER for symptoms that change or worsen if unable to schedule an appointment.

## 2019-12-16 NOTE — ED Notes (Signed)
See triage note  Presents with pain to left shoulder area  Denies any injury  Increased pain with movement
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
## 2021-04-05 ENCOUNTER — Encounter: Payer: Self-pay | Admitting: Emergency Medicine

## 2021-04-05 ENCOUNTER — Emergency Department
Admission: EM | Admit: 2021-04-05 | Discharge: 2021-04-05 | Disposition: A | Discharge status: Home / Self Care | Payer: Self-pay | Attending: Emergency Medicine | Admitting: Emergency Medicine

## 2021-04-05 ENCOUNTER — Other Ambulatory Visit: Payer: Self-pay

## 2021-04-05 ENCOUNTER — Emergency Department: Payer: Self-pay

## 2021-04-05 DIAGNOSIS — R310 Gross hematuria: Secondary | ICD-10-CM | POA: Insufficient documentation

## 2021-04-05 DIAGNOSIS — J449 Chronic obstructive pulmonary disease, unspecified: Secondary | ICD-10-CM | POA: Insufficient documentation

## 2021-04-05 DIAGNOSIS — R350 Frequency of micturition: Secondary | ICD-10-CM | POA: Insufficient documentation

## 2021-04-05 DIAGNOSIS — F17210 Nicotine dependence, cigarettes, uncomplicated: Secondary | ICD-10-CM | POA: Insufficient documentation

## 2021-04-05 LAB — BASIC METABOLIC PANEL
Anion gap: 5 (ref 5–15)
BUN: 13 mg/dL (ref 6–20)
CO2: 23 mmol/L (ref 22–32)
Calcium: 8.3 mg/dL — ABNORMAL LOW (ref 8.9–10.3)
Chloride: 107 mmol/L (ref 98–111)
Creatinine, Ser: 0.91 mg/dL (ref 0.61–1.24)
GFR, Estimated: >60 mL/min (ref >60)
Glucose, Bld: 105 mg/dL — ABNORMAL HIGH (ref 70–99)
Potassium: 3.9 mmol/L (ref 3.5–5.1)
Sodium: 135 mmol/L (ref 135–145)

## 2021-04-05 LAB — URINALYSIS, ROUTINE W REFLEX MICROSCOPIC
Bilirubin Urine: NEGATIVE
Glucose, UA: NEGATIVE mg/dL
Hgb urine dipstick: NEGATIVE
Ketones, ur: NEGATIVE mg/dL
Leukocytes,Ua: NEGATIVE
Nitrite: NEGATIVE
Protein, ur: NEGATIVE mg/dL
Specific Gravity, Urine: >1.03 — ABNORMAL HIGH (ref 1.005–1.030)
pH: 5.5 (ref 5.0–8.0)

## 2021-04-05 LAB — CBC
HCT: 45.6 % (ref 39.0–52.0)
Hemoglobin: 15.1 g/dL (ref 13.0–17.0)
MCH: 29.5 pg (ref 26.0–34.0)
MCHC: 33.1 g/dL (ref 30.0–36.0)
MCV: 89.1 fL (ref 80.0–100.0)
Platelets: 187 10*3/uL (ref 150–400)
RBC: 5.12 MIL/uL (ref 4.22–5.81)
RDW: 13.9 % (ref 11.5–15.5)
WBC: 8.2 10*3/uL (ref 4.0–10.5)
nRBC: 0 % (ref 0.0–0.2)

## 2021-04-05 MED ORDER — TAMSULOSIN HCL 0.4 MG PO CAPS: 0.4000 mg | ORAL_CAPSULE | Freq: Every day | ORAL | 0 refills | Status: AC

## 2021-04-05 NOTE — ED Notes (Signed)
Pt states he noticed blood in his urine this am, pt denies pain with urination. Pt states that he sometimes has difficulty urinating.

## 2021-04-05 NOTE — ED Triage Notes (Signed)
Pt comes into the ED via POV c/o hematuria this morning.  Pt denies any burning or increased frequency with urination.  Pt states he did have a little dysuria last week.  Pt in NAD with even and unlabored respirations.  Pt does admit to right lower back pain that has been ongoing for over a week.  Pt denies any known history of kidney stones.
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
## 2021-12-30 ENCOUNTER — Emergency Department
Admission: EM | Admit: 2021-12-30 | Discharge: 2021-12-30 | Disposition: A | Discharge status: Home / Self Care | Payer: Self-pay | Attending: Emergency Medicine | Admitting: Emergency Medicine

## 2021-12-30 ENCOUNTER — Other Ambulatory Visit: Payer: Self-pay

## 2021-12-30 ENCOUNTER — Emergency Department: Payer: Self-pay

## 2021-12-30 ENCOUNTER — Encounter: Payer: Self-pay | Admitting: Emergency Medicine

## 2021-12-30 DIAGNOSIS — J449 Chronic obstructive pulmonary disease, unspecified: Secondary | ICD-10-CM | POA: Insufficient documentation

## 2021-12-30 DIAGNOSIS — Z20822 Contact with and (suspected) exposure to covid-19: Secondary | ICD-10-CM | POA: Insufficient documentation

## 2021-12-30 DIAGNOSIS — R600 Localized edema: Secondary | ICD-10-CM | POA: Insufficient documentation

## 2021-12-30 DIAGNOSIS — R197 Diarrhea, unspecified: Secondary | ICD-10-CM | POA: Insufficient documentation

## 2021-12-30 DIAGNOSIS — R1033 Periumbilical pain: Secondary | ICD-10-CM | POA: Insufficient documentation

## 2021-12-30 DIAGNOSIS — R112 Nausea with vomiting, unspecified: Secondary | ICD-10-CM | POA: Insufficient documentation

## 2021-12-30 LAB — RESP PANEL BY RT-PCR (FLU A&B, COVID) ARPGX2
Influenza A by PCR: NEGATIVE
Influenza B by PCR: NEGATIVE
SARS Coronavirus 2 by RT PCR: NEGATIVE

## 2021-12-30 LAB — COMPREHENSIVE METABOLIC PANEL
ALT: 22 U/L (ref 0–44)
AST: 18 U/L (ref 15–41)
Albumin: 3.5 g/dL (ref 3.5–5.0)
Alkaline Phosphatase: 71 U/L (ref 38–126)
Anion gap: 7 (ref 5–15)
BUN: 13 mg/dL (ref 6–20)
CO2: 24 mmol/L (ref 22–32)
Calcium: 8.6 mg/dL — ABNORMAL LOW (ref 8.9–10.3)
Chloride: 109 mmol/L (ref 98–111)
Creatinine, Ser: 1.13 mg/dL (ref 0.61–1.24)
GFR, Estimated: >60 mL/min (ref >60)
Glucose, Bld: 109 mg/dL — ABNORMAL HIGH (ref 70–99)
Potassium: 3.9 mmol/L (ref 3.5–5.1)
Sodium: 140 mmol/L (ref 135–145)
Total Bilirubin: 0.7 mg/dL (ref 0.3–1.2)
Total Protein: 6.4 g/dL — ABNORMAL LOW (ref 6.5–8.1)

## 2021-12-30 LAB — CBC
HCT: 45.9 % (ref 39.0–52.0)
Hemoglobin: 15.2 g/dL (ref 13.0–17.0)
MCH: 30 pg (ref 26.0–34.0)
MCHC: 33.1 g/dL (ref 30.0–36.0)
MCV: 90.5 fL (ref 80.0–100.0)
Platelets: 166 10*3/uL (ref 150–400)
RBC: 5.07 MIL/uL (ref 4.22–5.81)
RDW: 13.2 % (ref 11.5–15.5)
WBC: 7 10*3/uL (ref 4.0–10.5)
nRBC: 0 % (ref 0.0–0.2)

## 2021-12-30 LAB — BRAIN NATRIURETIC PEPTIDE: B Natriuretic Peptide: 13.4 pg/mL (ref 0.0–100.0)

## 2021-12-30 LAB — LIPASE, BLOOD: Lipase: 29 U/L (ref 11–51)

## 2021-12-30 MED ORDER — ONDANSETRON 4 MG PO TBDP: 4.0000 mg | ORAL_TABLET | Freq: Three times a day (TID) | ORAL | 0 refills | Status: AC | PRN

## 2021-12-30 MED ORDER — DICYCLOMINE HCL 10 MG PO CAPS: 10.0000 mg | ORAL_CAPSULE | Freq: Three times a day (TID) | ORAL | 0 refills | Status: DC

## 2021-12-30 MED ORDER — IOHEXOL 300 MG/ML  SOLN
100.0000 mL | Freq: Once | INTRAMUSCULAR | Status: AC | PRN
  Administered 2021-12-30: 100 mL via INTRAVENOUS

## 2021-12-30 NOTE — ED Notes (Addendum)
Pt verbalized understanding of discharge instructions, prescriptions, and follow-up care instructions. Pt advised if symptoms worsen to return to ED. E-signature not available due to e-signature pad not working.

## 2021-12-30 NOTE — Discharge Instructions (Signed)
You can take Bentyl and Zofran for abdominal spasms and nausea.

## 2021-12-30 NOTE — ED Provider Notes (Signed)
[facility] Provider Note  Patient Contact: 4:38 PM (approximate)   History   Nausea, Emesis, and Diarrhea   HPI  Saagar Tortorella is a 56 y.o. male with a history of umbilical hernia, lipoma, COPD and GERD, presents to the emergency department with periumbilical abdominal pain, vomiting and diarrhea over the past 2 days.  Patient denies fever and chills.  Patient also states that he noticed some swelling of his bilateral feet and right hand yesterday which is since resolved.  He denies chest pain, chest tightness or shortness of breath.      Physical Exam   Triage Vital Signs: ED Triage Vitals  Enc Vitals Group     BP 12/30/21 1231 124/84     Pulse Rate 12/30/21 1231 67     Resp 12/30/21 1231 20     Temp 12/30/21 1231 97.9 F (36.6 C)     Temp Source 12/30/21 1231 Oral     SpO2 12/30/21 1231 98 %     Weight 12/30/21 1230 300 lb (136.1 kg)     Height 12/30/21 1230 '5\' 8"'$  (1.727 m)     Head Circumference --      Peak Flow --      Pain Score 12/30/21 1230 0     Pain Loc --      Pain Edu? --      Excl. in [place]? --     Most recent vital signs: Vitals:   12/30/21 1231 12/30/21 1845  BP: 124/84 (!) 150/94  Pulse: 67 78  Resp: 20 20  Temp: 97.9 F (36.6 C) 98 F (36.7 C)  SpO2: 98% 96%     General: Alert and in no acute distress. Eyes:  PERRL. EOMI. Head: No acute traumatic findings ENT:      Nose: No congestion/rhinnorhea.      Mouth/Throat: Mucous membranes are moist. Neck: No stridor. No cervical spine tenderness to palpation. Cardiovascular:  Good peripheral perfusion Respiratory: Normal respiratory effort without tachypnea or retractions. Lungs CTAB. Good air entry to the bases with no decreased or absent breath sounds. Gastrointestinal: Bowel sounds 4 quadrants. Soft and nontender to palpation. No guarding or rigidity. No palpable masses. No distention. No CVA tenderness. Musculoskeletal: Full range of motion to all extremities.   Neurologic:  No gross focal neurologic deficits are appreciated.  Skin:   No rash noted Other:   ED Results / Procedures / Treatments   Labs (all labs ordered are listed, but only abnormal results are displayed) Labs Reviewed  COMPREHENSIVE METABOLIC PANEL - Abnormal; Notable for the following components:      Result Value   Glucose, Bld 109 (*)    Calcium 8.6 (*)    Total Protein 6.4 (*)    All other components within normal limits  RESP PANEL BY RT-PCR (FLU A&B, COVID) ARPGX2  LIPASE, BLOOD  CBC  BRAIN NATRIURETIC PEPTIDE  URINALYSIS, ROUTINE W REFLEX MICROSCOPIC       RADIOLOGY  I personally viewed and evaluated these images as part of my medical decision making, as well as reviewing the written report by the radiologist.  ED Provider Interpretation: No acute abnormality in the abdomen or pelvis.   PROCEDURES:  Critical Care performed: No  Procedures   MEDICATIONS ORDERED IN ED: Medications  iohexol (OMNIPAQUE) 300 MG/ML solution 100 mL (100 mLs Intravenous Contrast Given 12/30/21 1921)     IMPRESSION / MDM / ASSESSMENT AND PLAN / ED COURSE  I reviewed the triage vital signs  and the nursing notes.                              Assessment and plan Abdominal pain 56 year old male presents to the emergency department with periumbilical abdominal pain, vomiting and diarrhea for the past 2 days  Vital signs reassuring at triage.  On exam, patient was alert, active and nontoxic-appearing.  CBC, CMP and lipase within range.  BNP within range.  COVID and influenza testing negative.  Patient had no pitting edema of the right upper extremity or the bilateral lower extremities.  Will obtain CT abdomen pelvis and will reassess.  No acute abnormality in the abdomen or pelvis.  Upon recheck, patient was reassured by results and requested a work note.  I prescribed patient Zofran and Bentyl for nausea and abdominal spasms.   FINAL CLINICAL IMPRESSION(S) / ED DIAGNOSES    Final diagnoses:  Periumbilical abdominal pain     Rx / DC Orders   ED Discharge Orders          Ordered    ondansetron (ZOFRAN-ODT) 4 MG disintegrating tablet  Every 8 hours PRN        12/30/21 1958    dicyclomine (BENTYL) 10 MG capsule  3 times daily before meals & bedtime        12/30/21 1958             Note:  This document was prepared using Dragon voice recognition software and may include unintentional dictation errors.   Vallarie Mare [place], Hershal Coria 12/30/21 Antionette Char, MD 12/30/21 [phone]

## 2021-12-30 NOTE — ED Provider Triage Note (Signed)
Emergency Medicine Provider Triage Evaluation Note  Barry Raymond, a 56 y.o. male  was evaluated in triage.  Pt complains of nausea, vomiting, diarrhea as well as some swelling to his hands and feet.  Patient denies any sick contacts, recent travel, or bad food exposure.  He also denies any significant medical history.  He denies any FCS.  Review of Systems  Positive: NVD Negative: FCS  Physical Exam  BP 124/84 (BP Location: Left Arm)   Pulse 67   Temp 97.9 F (36.6 C) (Oral)   Resp 20   Ht '5\' 8"'$  (1.727 m)   Wt 136.1 kg   SpO2 98%   BMI 45.61 kg/m  Gen:   Awake, no distress  NAD Resp:  Normal effort CTA MSK:   Moves extremities without difficulty  CVS:  RRR  Medical Decision Making  Medically screening exam initiated at 12:54 PM.  Appropriate orders placed.  Barry Raymond was informed that the remainder of the evaluation will be completed by another provider, this initial triage assessment does not replace that evaluation, and the importance of remaining in the ED until their evaluation is complete.  Patient to the ED for 3 days of intermittent nausea, vomiting, diarrhea.  Patient also endorses some edema to his hands and his legs.  Denies any chest pain or shortness of breath.   Melvenia Needles, PA-C 12/30/21 1257

## 2021-12-30 NOTE — ED Triage Notes (Signed)
Pt reports NVD and swelling in his feet and hands for 3 days. Denies recent exposures, denies medical hx. Denies fevers chills or other sx's.

## 2022-09-04 DIAGNOSIS — Z419 Encounter for procedure for purposes other than remedying health state, unspecified: Secondary | ICD-10-CM | POA: Diagnosis not present

## 2022-10-05 DIAGNOSIS — Z419 Encounter for procedure for purposes other than remedying health state, unspecified: Secondary | ICD-10-CM | POA: Diagnosis not present

## 2022-10-29 ENCOUNTER — Ambulatory Visit: Payer: Medicaid Other | Admitting: Nurse Practitioner

## 2022-11-04 DIAGNOSIS — Z419 Encounter for procedure for purposes other than remedying health state, unspecified: Secondary | ICD-10-CM | POA: Diagnosis not present

## 2022-12-05 DIAGNOSIS — Z419 Encounter for procedure for purposes other than remedying health state, unspecified: Secondary | ICD-10-CM | POA: Diagnosis not present

## 2022-12-09 ENCOUNTER — Encounter: Payer: Self-pay | Admitting: Nurse Practitioner

## 2022-12-09 ENCOUNTER — Ambulatory Visit (INDEPENDENT_AMBULATORY_CARE_PROVIDER_SITE_OTHER): Payer: Medicaid Other | Admitting: Nurse Practitioner

## 2022-12-09 VITALS — BP 130/80 | HR 72 | Temp 98.5°F | Ht 68.0 in | Wt 295.8 lb

## 2022-12-09 DIAGNOSIS — M545 Low back pain, unspecified: Secondary | ICD-10-CM

## 2022-12-09 DIAGNOSIS — G8929 Other chronic pain: Secondary | ICD-10-CM | POA: Insufficient documentation

## 2022-12-09 DIAGNOSIS — K429 Umbilical hernia without obstruction or gangrene: Secondary | ICD-10-CM | POA: Diagnosis not present

## 2022-12-09 MED ORDER — CYCLOBENZAPRINE HCL 5 MG PO TABS: 5.0000 mg | ORAL_TABLET | Freq: Three times a day (TID) | ORAL | 0 refills | Status: DC | PRN

## 2022-12-09 MED ORDER — NAPROXEN 500 MG PO TABS: 500.0000 mg | ORAL_TABLET | Freq: Two times a day (BID) | ORAL | 2 refills | Status: DC

## 2022-12-09 NOTE — Progress Notes (Signed)
Bethanie Dicker, NP-C Phone: [phone]  Barry Raymond is a 57 y.o. male who presents today to establish care.   Patient with history of large umbilical hernia for several years. He is requesting a referral to have it repaired. It has been getting larger. He occasionally has pain, it becomes sore with increased movements and activity.   Chronic Back Pain- Patient with back pain for several years that has been worsening. He has increased pain with standing and walking for prolonged periods of time. He has to take frequent breaks to sit down and rest in order to get relief. He has not tried any over the counter treatments or medications. He denies numbness and tingling in his legs.   Active Ambulatory Problems    Diagnosis Date Noted   Umbilical hernia 01/03/2015   Lipoma of lower back    Lipoma of back    S/P excision of lipoma 11/05/2016   Hematoma    Chronic bilateral low back pain without sciatica 12/09/2022   Resolved Ambulatory Problems    Diagnosis Date Noted   No Resolved Ambulatory Problems   Past Medical History:  Diagnosis Date   COPD (chronic obstructive pulmonary disease) (HCC)    GERD (gastroesophageal reflux disease)    Sleep apnea     Family History  Problem Relation Age of Onset   Heart disease Mother    Cancer Maternal Uncle     Social History   Socioeconomic History   Marital status: Legally Separated    Spouse name: Not on file   Number of children: Not on file   Years of education: Not on file   Highest education level: Not on file  Occupational History   Not on file  Tobacco Use   Smoking status: Light Smoker    Current packs/day: 0.20    Average packs/day: 0.2 packs/day for 20.0 years (4.0 ttl pk-yrs)    Types: Cigarettes   Smokeless tobacco: Never  Vaping Use   Vaping status: Never Used  Substance and Sexual Activity   Alcohol use: Yes    Comment: Ocassionally-BEER 2 X/ WEEK   Drug use: No   Sexual activity: Yes    Birth  control/protection: None  Other Topics Concern   Not on file  Social History Narrative   Not on file   Social Determinants of Health   Financial Resource Strain: Not on file  Food Insecurity: Not on file  Transportation Needs: Not on file  Physical Activity: Not on file  Stress: Not on file  Social Connections: Not on file  Intimate Partner Violence: Not on file    ROS  General:  Negative for unexplained weight loss, fever Skin: Negative for new or changing mole, sore that won't heal HEENT: Negative for trouble hearing, trouble seeing, ringing in ears, mouth sores, hoarseness, change in voice, dysphagia. CV:  Negative for chest pain, dyspnea, edema, palpitations Resp: Negative for cough, dyspnea, hemoptysis GI: Negative for nausea, vomiting, diarrhea, constipation, abdominal pain, melena, hematochezia. GU: Negative for dysuria, incontinence, urinary hesitance, hematuria, vaginal or penile discharge, polyuria, sexual difficulty, lumps in testicle or breasts MSK: Negative for swelling Neuro: Negative for headaches, weakness, numbness, dizziness, passing out/fainting Psych: Negative for depression, anxiety, memory problems  Objective  Physical Exam Vitals:   12/09/22 1334  BP: 130/80  Pulse: 72  Temp: 98.5 F (36.9 C)  SpO2: 95%    BP Readings from Last 3 Encounters:  12/09/22 130/80  12/30/21 119/84  04/05/21 132/83   Wt Readings from  Last 3 Encounters:  12/09/22 295 lb 12.8 oz (134.2 kg)  12/30/21 300 lb (136.1 kg)  04/05/21 276 lb 14.4 oz (125.6 kg)    Physical Exam Constitutional:      General: He is not in acute distress.    Appearance: Normal appearance.  HENT:     Head: Normocephalic.  Cardiovascular:     Rate and Rhythm: Normal rate and regular rhythm.     Heart sounds: Normal heart sounds.  Pulmonary:     Effort: Pulmonary effort is normal.     Breath sounds: Normal breath sounds.  Abdominal:     General: Bowel sounds are normal.      Palpations: Abdomen is soft.     Tenderness: There is no abdominal tenderness.     Hernia: A hernia (large, non-reducible) is present. Hernia is present in the umbilical area.  Musculoskeletal:     Lumbar back: Normal. No tenderness. Normal range of motion.  Skin:    General: Skin is warm and dry.  Neurological:     General: No focal deficit present.     Mental Status: He is alert.  Psychiatric:        Mood and Affect: Mood normal.        Behavior: Behavior normal.    Assessment/Plan:   Umbilical hernia without obstruction and without gangrene Assessment & Plan: Present for several years, getting larger. Non-reducible. Will refer to General Surgery for repair. Return precautions given to patient.   Orders: -     Ambulatory referral to General Surgery  Chronic bilateral low back pain without sciatica Assessment & Plan: Will treat with Flexeril 5 mg TID PRN and Naproxen. Encouraged to alternate ice and heat on painful areas. Will proceed with x-ray of lumbar spine if symptoms do not begin to improve. Will continue to monitor.   Orders: -     Naproxen; Take 1 tablet (500 mg total) by mouth 2 (two) times daily with a meal.  Dispense: 30 tablet; Refill: 2 -     Cyclobenzaprine HCl; Take 1 tablet (5 mg total) by mouth 3 (three) times daily as needed.  Dispense: 30 tablet; Refill: 0    Return for Annual Exam.   Bethanie Dicker, NP-C [facility] [employer]

## 2022-12-09 NOTE — Assessment & Plan Note (Signed)
Will treat with Flexeril 5 mg TID PRN and Naproxen. Encouraged to alternate ice and heat on painful areas. Will proceed with x-ray of lumbar spine if symptoms do not begin to improve. Will continue to monitor.

## 2022-12-09 NOTE — Assessment & Plan Note (Addendum)
Present for several years, getting larger. Non-reducible. Will refer to General Surgery for repair. Return precautions given to patient.

## 2022-12-16 ENCOUNTER — Ambulatory Visit: Payer: Medicaid Other | Admitting: Surgery

## 2022-12-30 ENCOUNTER — Ambulatory Visit (INDEPENDENT_AMBULATORY_CARE_PROVIDER_SITE_OTHER): Payer: Medicaid Other | Admitting: Surgery

## 2022-12-30 ENCOUNTER — Encounter: Payer: Self-pay | Admitting: Surgery

## 2022-12-30 VITALS — BP 124/66 | HR 72 | Temp 97.8°F | Ht 68.0 in | Wt 294.4 lb

## 2022-12-30 DIAGNOSIS — K436 Other and unspecified ventral hernia with obstruction, without gangrene: Secondary | ICD-10-CM

## 2022-12-30 DIAGNOSIS — K420 Umbilical hernia with obstruction, without gangrene: Secondary | ICD-10-CM | POA: Diagnosis not present
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
## 2022-12-30 NOTE — Patient Instructions (Signed)
You have requested for your Umbilical Hernia be repaired. This will be scheduled with Dr. Leland Johns at [facility].   Please see your (blue)pre-care sheet for information. Our surgery scheduler will call you to verify surgery date and to go over information.   You will need to arrange to be off work for 1-2 weeks but will have to have a lifting restriction of no more than 15 lbs for 6 weeks following your surgery. If you have FMLA or disability paperwork that needs filled out you may drop this off at our office or this can be faxed to [phone].     Umbilical Hernia, Adult A hernia is a bulge of tissue that pushes through an opening between muscles. An umbilical hernia happens in the abdomen, near the belly button (umbilicus). The hernia may contain tissues from the small intestine, large intestine, or fatty tissue covering the intestines (omentum). Umbilical hernias in adults tend to get worse over time, and they require surgical treatment. There are several types of umbilical hernias. You may have: A hernia located just above or below the umbilicus (indirect hernia). This is the most common type of umbilical hernia in adults. A hernia that forms through an opening formed by the umbilicus (direct hernia). A hernia that comes and goes (reducible hernia). A reducible hernia may be visible only when you strain, lift something heavy, or cough. This type of hernia can be pushed back into the abdomen (reduced). A hernia that traps abdominal tissue inside the hernia (incarcerated hernia). This type of hernia cannot be reduced. A hernia that cuts off blood flow to the tissues inside the hernia (strangulated hernia). The tissues can start to die if this happens. This type of hernia requires emergency treatment.  What are the causes? An umbilical hernia happens when tissue inside the abdomen presses on a weak area of the abdominal muscles. What increases the risk? You may have a  greater risk of this condition if you: Are obese. Have had several pregnancies. Have a buildup of fluid inside your abdomen (ascites). Have had surgery that weakens the abdominal muscles.  What are the signs or symptoms? The main symptom of this condition is a painless bulge at or near the belly button. A reducible hernia may be visible only when you strain, lift something heavy, or cough. Other symptoms may include: Dull pain. A feeling of pressure.  Symptoms of a strangulated hernia may include: Pain that gets increasingly worse. Nausea and vomiting. Pain when pressing on the hernia. Skin over the hernia becoming red or purple. Constipation. Blood in the stool.  How is this diagnosed? This condition may be diagnosed based on: A physical exam. You may be asked to cough or strain while standing. These actions increase the pressure inside your abdomen and force the hernia through the opening in your muscles. Your health care provider may try to reduce the hernia by pressing on it. Your symptoms and medical history.  How is this treated? Surgery is the only treatment for an umbilical hernia. Surgery for a strangulated hernia is done as soon as possible. If you have a small hernia that is not incarcerated, you may need to lose weight before having surgery. Follow these instructions at home: Lose weight, if told by your health care provider. Do not try to push the hernia back in. Watch your hernia for any changes in color or size. Tell your health care provider if any changes occur. You may need to avoid activities  that increase pressure on your hernia. Do not lift anything that is heavier than 10 lb (4.5 kg) until your health care provider says that this is safe. Take over-the-counter and prescription medicines only as told by your health care provider. Keep all follow-up visits as told by your health care provider. This is important. Contact a health care provider if: Your hernia  gets larger. Your hernia becomes painful. Get help right away if: You develop sudden, severe pain near the area of your hernia. You have pain as well as nausea or vomiting. You have pain and the skin over your hernia changes color. You develop a fever. This information is not intended to replace advice given to you by your health care provider. Make sure you discuss any questions you have with your health care provider. Document Released: 09/22/2015 Document Revised: 12/24/2015 Document Reviewed: 09/22/2015 Elsevier Interactive Patient Education  [employer].

## 2022-12-31 ENCOUNTER — Telehealth: Payer: Self-pay | Admitting: Surgery

## 2022-12-31 ENCOUNTER — Encounter: Payer: Medicaid Other | Admitting: Nurse Practitioner

## 2022-12-31 NOTE — Progress Notes (Deleted)
  Bethanie Dicker, NP-C Phone: [phone]  Barry Raymond is a 57 y.o. male who presents today for annual exam.   Diet: *** Exercise: *** Colonoscopy: Never- Due! Prostate cancer screening: Today! Family history-  Prostate cancer: ***  Colon cancer: *** Sexually active: *** Vaccines-   Flu: ***  Tetanus: ***  Shingles: ***  COVID19: *** HIV screening: Negative Hep C Screening: Today! Tobacco use: *** Alcohol use: *** Illicit Drug use: *** Dentist: *** Ophthalmology: ***   Social History   Tobacco Use  Smoking Status Light Smoker   Current packs/day: 0.20   Average packs/day: 0.2 packs/day for 20.0 years (4.0 ttl pk-yrs)   Types: Cigarettes  Smokeless Tobacco Never    Current Outpatient Medications on File Prior to Visit  Medication Sig Dispense Refill   cyclobenzaprine (FLEXERIL) 5 MG tablet Take 1 tablet (5 mg total) by mouth 3 (three) times daily as needed. 30 tablet 0   naproxen (NAPROSYN) 500 MG tablet Take 1 tablet (500 mg total) by mouth 2 (two) times daily with a meal. 30 tablet 2   No current facility-administered medications on file prior to visit.     ROS see history of present illness  Objective  Physical Exam There were no vitals filed for this visit.  BP Readings from Last 3 Encounters:  12/30/22 124/66  12/09/22 130/80  12/30/21 119/84   Wt Readings from Last 3 Encounters:  12/30/22 294 lb 6.4 oz (133.5 kg)  12/09/22 295 lb 12.8 oz (134.2 kg)  12/30/21 300 lb (136.1 kg)    Physical Exam   Assessment/Plan: Please see individual problem list.  There are no diagnoses linked to this encounter.   Health Maintenance: ***  No follow-ups on file.   Bethanie Dicker, NP-C [facility] [employer]

## 2022-12-31 NOTE — Telephone Encounter (Signed)
Left message for patient to call,please inform him of the following regarding scheduled surgery with Dr. Aleen Campi.   Pre-Admission date/time, and Surgery date at [facility].  Surgery Date: 01/16/23 Preadmission Testing Date: 01/08/23 (phone 1p-4p)  Also patient will need to call at [phone], between 1-3:00pm the day before surgery, to find out what time to arrive for surgery.

## 2023-01-01 NOTE — Telephone Encounter (Signed)
Tried calling patient again, not able to leave a message.

## 2023-01-02 NOTE — Telephone Encounter (Signed)
Tried multiple times trying to reach this patient, also tried the numbers on recent DPR.  Unable to leave a message with all numbers available.  Need to get in touch with patient so that we can provide surgery information to him.

## 2023-01-03 ENCOUNTER — Encounter: Payer: Self-pay | Admitting: Urgent Care

## 2023-01-05 DIAGNOSIS — Z419 Encounter for procedure for purposes other than remedying health state, unspecified: Secondary | ICD-10-CM | POA: Diagnosis not present

## 2023-01-07 NOTE — Telephone Encounter (Signed)
To date, numerous attempts have been made to contact patient, not able to leave a message and wife's number has been disconnected.

## 2023-01-08 ENCOUNTER — Inpatient Hospital Stay
Admission: RE | Admit: 2023-01-08 | Discharge: 2023-01-08 | Disposition: A | Discharge status: Home / Self Care | Payer: Medicaid Other | Source: Ambulatory Visit

## 2023-01-08 NOTE — Pre-Procedure Instructions (Signed)
Multiple attempts made to reach patient for his scheduled PAT call today, not able to get call to go through on number provided, wifes phone has been disconnected. Will continue to call, Dr. Teresa Pelton office made aware.

## 2023-01-09 ENCOUNTER — Telehealth: Payer: Self-pay | Admitting: Nurse Practitioner

## 2023-01-09 ENCOUNTER — Encounter
Admission: RE | Admit: 2023-01-09 | Discharge: 2023-01-09 | Disposition: A | Discharge status: Home / Self Care | Payer: Medicaid Other | Source: Ambulatory Visit | Attending: Surgery | Admitting: Surgery

## 2023-01-09 ENCOUNTER — Other Ambulatory Visit: Payer: Self-pay

## 2023-01-09 DIAGNOSIS — R0602 Shortness of breath: Secondary | ICD-10-CM

## 2023-01-09 DIAGNOSIS — Z01812 Encounter for preprocedural laboratory examination: Secondary | ICD-10-CM

## 2023-01-09 HISTORY — DX: Morbid (severe) obesity due to excess calories: E66.01

## 2023-01-09 HISTORY — DX: Unspecified osteoarthritis, unspecified site: M19.90

## 2023-01-09 NOTE — Telephone Encounter (Signed)
To date, numerous attempts have been made to contact patient about his surgery.  Preadmissions also has been trying to call.  Patient not returning any of our calls.

## 2023-01-09 NOTE — Telephone Encounter (Signed)
Patient just called and ask can he get a referrel for substance abuse. His number is [phone].

## 2023-01-09 NOTE — Patient Instructions (Signed)
Your procedure is scheduled on: 01/16/23 - Thursday Report to the Registration Desk on the 1st floor of the [facility]. To find out your arrival time, please call [phone] between 1PM - 3PM on: 01/15/23 - Wednesday If your arrival time is 6:00 am, do not arrive before that time as the [facility] entrance doors do not open until 6:00 am.  REMEMBER: Instructions that are not followed completely may result in serious medical risk, up to and including death; or upon the discretion of your surgeon and anesthesiologist your surgery may need to be rescheduled.  Do not eat food after midnight the night before surgery.  No gum chewing or hard candies.  You may however, drink CLEAR liquids up to 2 hours before you are scheduled to arrive for your surgery. Do not drink anything within 2 hours of your scheduled arrival time.  Clear liquids include: - water  - apple juice without pulp - gatorade (not RED colors) - black coffee or tea (Do NOT add milk or creamers to the coffee or tea) Do NOT drink anything that is not on this list.  One week prior to surgery: Stop Anti-inflammatories (NSAIDS) such as Advil, Aleve, Ibuprofen, Motrin, Naproxen, Naprosyn and Aspirin based products such as Excedrin, Goody's Powder, BC Powder. You may however, continue to take Tylenol if needed for pain up until the day of surgery.  Stop ANY OVER THE COUNTER supplements until after surgery.   TAKE ONLY THESE MEDICATIONS THE MORNING OF SURGERY WITH A SIP OF WATER:  NONE   No Alcohol for 24 hours before or after surgery.  No Smoking including e-cigarettes for 24 hours before surgery.  No chewable tobacco products for at least 6 hours before surgery.  No nicotine patches on the day of surgery.  Do not use any "recreational" drugs for at least a week (preferably 2 weeks) before your surgery.  Please be advised that the combination of cocaine and anesthesia may have negative outcomes, up to and including  death. If you test positive for cocaine, your surgery will be cancelled.  On the morning of surgery brush your teeth with toothpaste and water, you may rinse your mouth with mouthwash if you wish. Do not swallow any toothpaste or mouthwash.  Use CHG Soap or wipes as directed on instruction sheet.  Do not wear jewelry, make-up, hairpins, clips or nail polish.  Do not wear lotions, powders, or perfumes.   Do not shave body hair from the neck down 48 hours before surgery.  Contact lenses, hearing aids and dentures may not be worn into surgery.  Do not bring valuables to the hospital. [facility] is not responsible for any missing/lost belongings or valuables.   Notify your doctor if there is any change in your medical condition (cold, fever, infection).  Wear comfortable clothing (specific to your surgery type) to the hospital.  After surgery, you can help prevent lung complications by doing breathing exercises.  Take deep breaths and cough every 1-2 hours. Your doctor may order a device called an Incentive Spirometer to help you take deep breaths. When coughing or sneezing, hold a pillow firmly against your incision with both hands. This is called "splinting." Doing this helps protect your incision. It also decreases belly discomfort.  If you are being admitted to the hospital overnight, leave your suitcase in the car. After surgery it may be brought to your room.  In case of increased patient census, it may be necessary for you, the patient, to  continue your postoperative care in the Same Day Surgery department.  If you are being discharged the day of surgery, you will not be allowed to drive home. You will need a responsible individual to drive you home and stay with you for 24 hours after surgery.   If you are taking public transportation, you will need to have a responsible individual with you.  Please call the Pre-admissions Testing Dept. at [phone] if you have any  questions about these instructions.  Surgery Visitation Policy:  Patients having surgery or a procedure may have two visitors.  Children under the age of 63 must have an adult with them who is not the patient.  Inpatient Visitation:    Visiting hours are 7 a.m. to 8 p.m. Up to four visitors are allowed at one time in a patient room. The visitors may rotate out with other people during the day.  One visitor age 38 or older may stay with the patient overnight and must be in the room by 8 p.m.    Preparing for Surgery with CHLORHEXIDINE GLUCONATE (CHG) Soap  Chlorhexidine Gluconate (CHG) Soap  o An antiseptic cleaner that kills germs and bonds with the skin to continue killing germs even after washing  o Used for showering the night before surgery and morning of surgery  Before surgery, you can play an important role by reducing the number of germs on your skin.  CHG (Chlorhexidine gluconate) soap is an antiseptic cleanser which kills germs and bonds with the skin to continue killing germs even after washing.  Please do not use if you have an allergy to CHG or antibacterial soaps. If your skin becomes reddened/irritated stop using the CHG.  1. Shower the NIGHT BEFORE SURGERY and the MORNING OF SURGERY with CHG soap.  2. If you choose to wash your hair, wash your hair first as usual with your normal shampoo.  3. After shampooing, rinse your hair and body thoroughly to remove the shampoo.  4. Use CHG as you would any other liquid soap. You can apply CHG directly to the skin and wash gently with a scrungie or a clean washcloth.  5. Apply the CHG soap to your body only from the neck down. Do not use on open wounds or open sores. Avoid contact with your eyes, ears, mouth, and genitals (private parts). Wash face and genitals (private parts) with your normal soap.  6. Wash thoroughly, paying special attention to the area where your surgery will be performed.  7. Thoroughly rinse your  body with warm water.  8. Do not shower/wash with your normal soap after using and rinsing off the CHG soap.  9. Pat yourself dry with a clean towel.  10. Wear clean pajamas to bed the night before surgery.  12. Place clean sheets on your bed the night of your first shower and do not sleep with pets.  13. Shower again with the CHG soap on the day of surgery prior to arriving at the hospital.  14. Do not apply any deodorants/lotions/powders.  15. Please wear clean clothes to the hospital.

## 2023-01-09 NOTE — Telephone Encounter (Signed)
Tried to contact pt neither numbers in pts account is in service unable to get in contact with pt./

## 2023-01-10 ENCOUNTER — Other Ambulatory Visit: Payer: Medicaid Other

## 2023-01-13 ENCOUNTER — Other Ambulatory Visit: Payer: Medicaid Other

## 2023-01-14 ENCOUNTER — Encounter
Admission: RE | Admit: 2023-01-14 | Discharge: 2023-01-14 | Disposition: A | Discharge status: Home / Self Care | Payer: Medicaid Other | Source: Ambulatory Visit | Attending: Surgery | Admitting: Surgery

## 2023-01-14 DIAGNOSIS — R0602 Shortness of breath: Secondary | ICD-10-CM | POA: Diagnosis not present

## 2023-01-14 DIAGNOSIS — Z01810 Encounter for preprocedural cardiovascular examination: Secondary | ICD-10-CM | POA: Diagnosis not present

## 2023-01-14 DIAGNOSIS — Z01818 Encounter for other preprocedural examination: Secondary | ICD-10-CM | POA: Insufficient documentation

## 2023-01-14 DIAGNOSIS — Z01812 Encounter for preprocedural laboratory examination: Secondary | ICD-10-CM

## 2023-01-14 LAB — CBC
HCT: 45.7 % (ref 39.0–52.0)
Hemoglobin: 15.6 g/dL (ref 13.0–17.0)
MCH: 30.1 pg (ref 26.0–34.0)
MCHC: 34.1 g/dL (ref 30.0–36.0)
MCV: 88.1 fL (ref 80.0–100.0)
Platelets: 141 10*3/uL — ABNORMAL LOW (ref 150–400)
RBC: 5.19 MIL/uL (ref 4.22–5.81)
RDW: 12.6 % (ref 11.5–15.5)
WBC: 11.7 10*3/uL — ABNORMAL HIGH (ref 4.0–10.5)
nRBC: 0 % (ref 0.0–0.2)

## 2023-01-14 LAB — BASIC METABOLIC PANEL
Anion gap: 8 (ref 5–15)
BUN: 14 mg/dL (ref 6–20)
CO2: 26 mmol/L (ref 22–32)
Calcium: 8.7 mg/dL — ABNORMAL LOW (ref 8.9–10.3)
Chloride: 105 mmol/L (ref 98–111)
Creatinine, Ser: 1 mg/dL (ref 0.61–1.24)
GFR, Estimated: >60 mL/min (ref >60)
Glucose, Bld: 124 mg/dL — ABNORMAL HIGH (ref 70–99)
Potassium: 3.6 mmol/L (ref 3.5–5.1)
Sodium: 139 mmol/L (ref 135–145)

## 2023-01-15 ENCOUNTER — Encounter: Payer: Self-pay | Admitting: Nurse Practitioner

## 2023-01-15 ENCOUNTER — Ambulatory Visit (INDEPENDENT_AMBULATORY_CARE_PROVIDER_SITE_OTHER): Payer: Medicaid Other | Admitting: Nurse Practitioner

## 2023-01-15 VITALS — BP 120/72 | HR 77 | Temp 98.6°F | Ht 68.0 in | Wt 292.6 lb

## 2023-01-15 DIAGNOSIS — Z13220 Encounter for screening for lipoid disorders: Secondary | ICD-10-CM | POA: Diagnosis not present

## 2023-01-15 DIAGNOSIS — N529 Male erectile dysfunction, unspecified: Secondary | ICD-10-CM | POA: Insufficient documentation

## 2023-01-15 DIAGNOSIS — R400 Somnolence: Secondary | ICD-10-CM | POA: Diagnosis not present

## 2023-01-15 DIAGNOSIS — Z1329 Encounter for screening for other suspected endocrine disorder: Secondary | ICD-10-CM

## 2023-01-15 DIAGNOSIS — M545 Low back pain, unspecified: Secondary | ICD-10-CM | POA: Diagnosis not present

## 2023-01-15 DIAGNOSIS — R3916 Straining to void: Secondary | ICD-10-CM

## 2023-01-15 DIAGNOSIS — Z6841 Body Mass Index (BMI) 40.0 and over, adult: Secondary | ICD-10-CM | POA: Diagnosis not present

## 2023-01-15 DIAGNOSIS — G8929 Other chronic pain: Secondary | ICD-10-CM | POA: Diagnosis not present

## 2023-01-15 DIAGNOSIS — R0683 Snoring: Secondary | ICD-10-CM | POA: Diagnosis not present

## 2023-01-15 DIAGNOSIS — N401 Enlarged prostate with lower urinary tract symptoms: Secondary | ICD-10-CM | POA: Diagnosis not present

## 2023-01-15 DIAGNOSIS — F1410 Cocaine abuse, uncomplicated: Secondary | ICD-10-CM

## 2023-01-15 LAB — CBC WITH DIFFERENTIAL/PLATELET
Basophils Absolute: 0.1 10*3/uL (ref 0.0–0.1)
Basophils Relative: 0.8 % (ref 0.0–3.0)
Eosinophils Absolute: 0.2 10*3/uL (ref 0.0–0.7)
Eosinophils Relative: 3 % (ref 0.0–5.0)
HCT: 45 % (ref 39.0–52.0)
Hemoglobin: 14.8 g/dL (ref 13.0–17.0)
Lymphocytes Relative: 23.3 % (ref 12.0–46.0)
Lymphs Abs: 1.9 10*3/uL (ref 0.7–4.0)
MCHC: 33 g/dL (ref 30.0–36.0)
MCV: 90.5 fl (ref 78.0–100.0)
Monocytes Absolute: 0.8 10*3/uL (ref 0.1–1.0)
Monocytes Relative: 9.9 % (ref 3.0–12.0)
Neutro Abs: 5 10*3/uL (ref 1.4–7.7)
Neutrophils Relative %: 63 % (ref 43.0–77.0)
Platelets: 196 10*3/uL (ref 150.0–400.0)
RBC: 4.98 Mil/uL (ref 4.22–5.81)
RDW: 13.4 % (ref 11.5–15.5)
WBC: 8 10*3/uL (ref 4.0–10.5)

## 2023-01-15 LAB — COMPREHENSIVE METABOLIC PANEL
ALT: 18 U/L (ref 0–53)
AST: 12 U/L (ref 0–37)
Albumin: 3.5 g/dL (ref 3.5–5.2)
Alkaline Phosphatase: 83 U/L (ref 39–117)
BUN: 17 mg/dL (ref 6–23)
CO2: 27 meq/L (ref 19–32)
Calcium: 8.8 mg/dL (ref 8.4–10.5)
Chloride: 106 meq/L (ref 96–112)
Creatinine, Ser: 0.97 mg/dL (ref 0.40–1.50)
GFR: 88.08 mL/min (ref >60.00)
Glucose, Bld: 136 mg/dL — ABNORMAL HIGH (ref 70–99)
Potassium: 3.8 meq/L (ref 3.5–5.1)
Sodium: 140 meq/L (ref 135–145)
Total Bilirubin: 0.4 mg/dL (ref 0.2–1.2)
Total Protein: 6.2 g/dL (ref 6.0–8.3)

## 2023-01-15 LAB — PSA: PSA: 0.65 ng/mL (ref 0.10–4.00)

## 2023-01-15 LAB — URINALYSIS, ROUTINE W REFLEX MICROSCOPIC
Hgb urine dipstick: NEGATIVE
Ketones, ur: NEGATIVE
Leukocytes,Ua: NEGATIVE
Nitrite: NEGATIVE
Specific Gravity, Urine: 1.025 (ref 1.000–1.030)
Total Protein, Urine: NEGATIVE
Urine Glucose: NEGATIVE
Urobilinogen, UA: 4 — AB (ref 0.0–1.0)
pH: 6.5 (ref 5.0–8.0)

## 2023-01-15 LAB — LIPID PANEL
Cholesterol: 188 mg/dL (ref 0–200)
HDL: 32.9 mg/dL — ABNORMAL LOW (ref >39.00)
LDL Cholesterol: 118 mg/dL — ABNORMAL HIGH (ref 0–99)
NonHDL: 154.65
Total CHOL/HDL Ratio: 6
Triglycerides: 181 mg/dL — ABNORMAL HIGH (ref 0.0–149.0)
VLDL: 36.2 mg/dL (ref 0.0–40.0)

## 2023-01-15 LAB — HEMOGLOBIN A1C: Hgb A1c MFr Bld: 6.4 % (ref 4.6–6.5)

## 2023-01-15 LAB — TSH: TSH: 3.12 u[IU]/mL (ref 0.35–5.50)

## 2023-01-15 MED ORDER — ACETAMINOPHEN 500 MG PO TABS: 1000.0000 mg | ORAL_TABLET | ORAL | Status: DC

## 2023-01-15 MED ORDER — BUPIVACAINE LIPOSOME 1.3 % IJ SUSP: 20.0000 mL | Freq: Once | INTRAMUSCULAR | Status: DC

## 2023-01-15 MED ORDER — LACTATED RINGERS IV SOLN: INTRAVENOUS | Status: DC

## 2023-01-15 MED ORDER — CEFAZOLIN IN SODIUM CHLORIDE 3-0.9 GM/100ML-% IV SOLN
3.0000 g | INTRAVENOUS | Status: DC
  Filled 2023-01-15: qty 100

## 2023-01-15 MED ORDER — GABAPENTIN 300 MG PO CAPS: 300.0000 mg | ORAL_CAPSULE | ORAL | Status: DC

## 2023-01-15 MED ORDER — FAMOTIDINE 20 MG PO TABS: 20.0000 mg | ORAL_TABLET | Freq: Once | ORAL | Status: DC

## 2023-01-15 NOTE — Progress Notes (Signed)
Bethanie Dicker, NP-C Phone: [phone]  Barry Raymond is a 57 y.o. male who presents today for multiple complaints. He is scheduled to have a hernia repair tomorrow.   Snoring- Patient requesting sleep study. His wife has told him that he snores very loudly. He has noticed waking up recently with a sore throat, which he believes is due to his snoring. He does have some daytime sleepiness and feels that he does not get restful sleep at night. Denies any apneic events.   Chronic Back Pain- Patient with back pain for several years that has been worsening. He was started on Flexeril at his last visit in August. He does not feel that it has helped at all. He has increased pain with standing and walking for prolonged periods of time. He has to take frequent breaks to sit down and rest in order to get relief. He has not tried any over the counter treatments or medications. He denies numbness and tingling in his legs.   Cocaine use- Patient requesting help with his cocaine abuse. He has been using at least once a week, socially.   Erectile Dysfunction/Weak Urination- Patient has difficulty both maintaining and attaining an erection. He also feels that he has a weak urinary stream. He has difficulty initiating a urine stream. He feels that he is not completely emptying his bladder when he uses the restroom. Denies dysuria.   Social History   Tobacco Use  Smoking Status Some Days   Current packs/day: 0.20   Average packs/day: 0.2 packs/day for 20.0 years (4.0 ttl pk-yrs)   Types: Cigarettes  Smokeless Tobacco Never    Current Outpatient Medications on File Prior to Visit  Medication Sig Dispense Refill   cyclobenzaprine (FLEXERIL) 5 MG tablet Take 1 tablet (5 mg total) by mouth 3 (three) times daily as needed. 30 tablet 0   naproxen (NAPROSYN) 500 MG tablet Take 1 tablet (500 mg total) by mouth 2 (two) times daily with a meal. 30 tablet 2   No current facility-administered medications on file prior  to visit.     ROS see history of present illness  Objective  Physical Exam Vitals:   01/15/23 1300  BP: 120/72  Pulse: 77  Temp: 98.6 F (37 C)  SpO2: 95%    BP Readings from Last 3 Encounters:  01/15/23 120/72  12/30/22 124/66  12/09/22 130/80   Wt Readings from Last 3 Encounters:  01/15/23 292 lb 9.6 oz (132.7 kg)  12/30/22 294 lb 6.4 oz (133.5 kg)  12/09/22 295 lb 12.8 oz (134.2 kg)    Physical Exam Constitutional:      General: He is not in acute distress.    Appearance: Normal appearance.  HENT:     Head: Normocephalic.  Cardiovascular:     Rate and Rhythm: Normal rate and regular rhythm.     Heart sounds: Normal heart sounds.  Pulmonary:     Effort: Pulmonary effort is normal.     Breath sounds: Normal breath sounds.  Skin:    General: Skin is warm and dry.  Neurological:     General: No focal deficit present.     Mental Status: He is alert.  Psychiatric:        Mood and Affect: Mood normal.        Behavior: Behavior normal.    Assessment/Plan: Please see individual problem list.  Snoring Assessment & Plan: Will refer to Pulmonology for sleep study.   Orders: -     Ambulatory referral to  Pulmonology  Daytime sleepiness -     Ambulatory referral to Pulmonology -     CBC with Differential/Platelet  Erectile dysfunction, unspecified erectile dysfunction type Assessment & Plan: Will refer to Urology for further evaluation.   Orders: -     Ambulatory referral to Urology -     Comprehensive metabolic panel  Chronic bilateral low back pain without sciatica Assessment & Plan: Flexeril not helping. Will get x-ray of lumbar spine today. Encouraged to alternate ice and heat on painful area. He can take Naproxen BID as needed for pain. Will contact patient with x-ray results, further work up pending results.   Orders: -     DG Lumbar Spine 2-3 Views; Future  Cocaine abuse [facility]) Assessment & Plan: Contact information provided to patient for  [facility] for substance abuse treatment. He will call to schedule initial appointment. Will continue to monitor.    Benign prostatic hyperplasia (BPH) with straining on urination Assessment & Plan: Symptoms consistent with BPH. Will check urine and lab work as outlined and contact patient with results. Referral placed to Urology.   Orders: -     Ambulatory referral to Urology -     PSA -     Urinalysis, Routine w reflex microscopic -     Urine Culture  Morbid obesity with BMI of 40.0-44.9, adult [facility]) Assessment & Plan: Encouraged healthy diet and exercise. Will check A1c today. Counseled on health benefits of weight loss.   Orders: -     Ambulatory referral to Pulmonology -     Hemoglobin A1c  Lipid screening -     Lipid panel  Thyroid disorder screen -     TSH   Return in about 3 months (around 04/16/2023) for Follow up.   Bethanie Dicker, NP-C [facility] [employer]

## 2023-01-16 ENCOUNTER — Encounter: Admission: RE | Payer: Self-pay | Source: Home / Self Care

## 2023-01-16 ENCOUNTER — Ambulatory Visit: Admission: RE | Admit: 2023-01-16 | Payer: Medicaid Other | Source: Home / Self Care | Admitting: Surgery

## 2023-01-16 ENCOUNTER — Telehealth: Payer: Self-pay | Admitting: Surgery

## 2023-01-16 DIAGNOSIS — K436 Other and unspecified ventral hernia with obstruction, without gangrene: Secondary | ICD-10-CM

## 2023-01-16 LAB — URINE CULTURE
MICRO NUMBER:: 15452127
Result:: NO GROWTH
SPECIMEN QUALITY:: ADEQUATE

## 2023-01-16 SURGERY — XI ROBOTIC ASSISTED VENTRAL HERNIA
Anesthesia: General

## 2023-01-16 NOTE — Telephone Encounter (Signed)
Patient calls last minute this am to cancel his surgery for today. Says he has a runny nose and sore throat and will call us back in a month or so to reschedule.  I told patient that before we can reschedule he will need to see Dr. Aleen Campi for follow up in office.  The OR is called and made aware of this and as it turns out, they have been trying to get in touch with the patient multiple times and he could not be reached.

## 2023-01-17 ENCOUNTER — Telehealth: Payer: Self-pay

## 2023-01-17 NOTE — Telephone Encounter (Addendum)
Tried calling and could not leave a voice message due to voice mailbox not set up. Will try calling patient again   ----- Message from Nurse Morrie Sheldon T sent at 01/17/2023  8:01 AM EDT -----  ----- Message ----- From: Bethanie Dicker, NP Sent: 01/16/2023   4:42 PM EDT To: Donavan Foil, CMA  His A1c is prediabetic, he needs to work on healthy diet and exercise. This will also help with his cholesterol. The rest of his labs are stable. His urine does not show any signs of infection but I recommend following up with Urology regarding his symptoms.

## 2023-01-28 ENCOUNTER — Encounter: Payer: Self-pay | Admitting: Nurse Practitioner

## 2023-01-28 NOTE — Assessment & Plan Note (Signed)
Will refer to Pulmonology for sleep study.

## 2023-01-28 NOTE — Assessment & Plan Note (Signed)
Will refer to Urology for further evaluation

## 2023-01-28 NOTE — Assessment & Plan Note (Signed)
Encouraged healthy diet and exercise. Will check A1c today. Counseled on health benefits of weight loss.

## 2023-01-28 NOTE — Assessment & Plan Note (Signed)
Contact information provided to patient for [facility] for substance abuse treatment. He will call to schedule initial appointment. Will continue to monitor.

## 2023-01-28 NOTE — Assessment & Plan Note (Signed)
Symptoms consistent with BPH. Will check urine and lab work as outlined and contact patient with results. Referral placed to Urology.

## 2023-01-28 NOTE — Assessment & Plan Note (Signed)
Flexeril not helping. Will get x-ray of lumbar spine today. Encouraged to alternate ice and heat on painful area. He can take Naproxen BID as needed for pain. Will contact patient with x-ray results, further work up pending results.

## 2023-01-30 ENCOUNTER — Encounter: Payer: Medicaid Other | Admitting: Physician Assistant

## 2023-02-04 DIAGNOSIS — Z419 Encounter for procedure for purposes other than remedying health state, unspecified: Secondary | ICD-10-CM | POA: Diagnosis not present

## 2023-02-12 ENCOUNTER — Ambulatory Visit: Payer: Medicaid Other | Admitting: Urology

## 2023-02-12 ENCOUNTER — Encounter: Payer: Self-pay | Admitting: Urology

## 2023-03-07 DIAGNOSIS — Z419 Encounter for procedure for purposes other than remedying health state, unspecified: Secondary | ICD-10-CM | POA: Diagnosis not present

## 2023-03-18 ENCOUNTER — Institutional Professional Consult (permissible substitution): Payer: Medicaid Other | Admitting: Nurse Practitioner

## 2023-04-06 DIAGNOSIS — Z419 Encounter for procedure for purposes other than remedying health state, unspecified: Secondary | ICD-10-CM | POA: Diagnosis not present

## 2023-04-16 ENCOUNTER — Ambulatory Visit: Payer: Medicaid Other | Admitting: Nurse Practitioner

## 2023-04-21 ENCOUNTER — Telehealth: Payer: Self-pay

## 2023-04-21 NOTE — Telephone Encounter (Signed)
Patient states he is experiencing back pain, sometimes sharp. Patient states he can only stand up about 5-10 minutes and then he have to bend over because of his back. Patient states he is short of breath when he walks. Patient states both feet are swollen.   I transferred call to Access Nurse.

## 2023-04-24 ENCOUNTER — Encounter: Payer: Self-pay | Admitting: Nurse Practitioner

## 2023-04-24 ENCOUNTER — Ambulatory Visit (INDEPENDENT_AMBULATORY_CARE_PROVIDER_SITE_OTHER): Payer: Medicaid Other | Admitting: Nurse Practitioner

## 2023-04-24 VITALS — BP 130/84 | HR 75 | Temp 97.9°F | Ht 68.0 in | Wt 322.6 lb

## 2023-04-24 DIAGNOSIS — G8929 Other chronic pain: Secondary | ICD-10-CM

## 2023-04-24 DIAGNOSIS — M545 Low back pain, unspecified: Secondary | ICD-10-CM | POA: Diagnosis not present

## 2023-04-24 DIAGNOSIS — R600 Localized edema: Secondary | ICD-10-CM | POA: Diagnosis not present

## 2023-04-24 MED ORDER — FUROSEMIDE 20 MG PO TABS: 20.0000 mg | ORAL_TABLET | Freq: Every day | ORAL | 0 refills | Status: DC

## 2023-04-24 MED ORDER — MELOXICAM 7.5 MG PO TABS: 7.5000 mg | ORAL_TABLET | Freq: Every day | ORAL | 0 refills | Status: DC

## 2023-04-24 MED ORDER — PREDNISONE 10 MG PO TABS: ORAL_TABLET | ORAL | 0 refills | Status: DC
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
## 2023-04-24 NOTE — Patient Instructions (Signed)
Please schedule the X-ray appointment on Monday at check out. RX send to pharmacy. Alternate hot and cold pack.

## 2023-04-28 ENCOUNTER — Other Ambulatory Visit: Payer: Medicaid Other

## 2023-05-05 ENCOUNTER — Ambulatory Visit
Admission: RE | Admit: 2023-05-05 | Discharge: 2023-05-05 | Disposition: A | Discharge status: Home / Self Care | Payer: Medicaid Other | Attending: Nurse Practitioner | Admitting: Nurse Practitioner

## 2023-05-05 ENCOUNTER — Telehealth: Payer: Self-pay | Admitting: Surgery

## 2023-05-05 ENCOUNTER — Other Ambulatory Visit: Payer: Medicaid Other

## 2023-05-05 ENCOUNTER — Ambulatory Visit
Admission: RE | Admit: 2023-05-05 | Discharge: 2023-05-05 | Disposition: A | Discharge status: Home / Self Care | Payer: Medicaid Other | Source: Ambulatory Visit | Attending: Nurse Practitioner

## 2023-05-05 DIAGNOSIS — M545 Low back pain, unspecified: Secondary | ICD-10-CM | POA: Insufficient documentation

## 2023-05-05 DIAGNOSIS — G8929 Other chronic pain: Secondary | ICD-10-CM

## 2023-05-05 DIAGNOSIS — R600 Localized edema: Secondary | ICD-10-CM | POA: Insufficient documentation

## 2023-05-05 DIAGNOSIS — M47816 Spondylosis without myelopathy or radiculopathy, lumbar region: Secondary | ICD-10-CM | POA: Diagnosis not present

## 2023-05-05 NOTE — Assessment & Plan Note (Deleted)
Midline low back pain without sciatica.  Will treat with meloxicam and prednisone tapering. Imaging pending. Advised patient to alternate hot and cold compress. Will recheck in 2 weeks

## 2023-05-05 NOTE — Telephone Encounter (Signed)
Patient came into the office today requesting to reschedule his surgery with Dr. Aleen Campi.   He can be reached at [phone].

## 2023-05-05 NOTE — Telephone Encounter (Signed)
Patient cancelled surgery for ventral  hernia repair in September 2024. Patient now wants to reschedule surgery.  Will need to see in office first before rescheduling.

## 2023-05-05 NOTE — Assessment & Plan Note (Signed)
Bilateral lower leg edema.Will try furosemide 20 mg daily. Advised patient to keep the feet elevated and use compression stockings. Close follow-up

## 2023-05-05 NOTE — Assessment & Plan Note (Signed)
Midline low back pain without sciatica.  Will treat with meloxicam and prednisone tapering. Imaging pending. Advised patient to alternate hot and cold compress. Will recheck in 2 weeks

## 2023-05-07 DIAGNOSIS — Z419 Encounter for procedure for purposes other than remedying health state, unspecified: Secondary | ICD-10-CM | POA: Diagnosis not present

## 2023-05-15 ENCOUNTER — Ambulatory Visit: Payer: Medicaid Other | Admitting: Nurse Practitioner

## 2023-05-20 ENCOUNTER — Telehealth: Payer: Self-pay

## 2023-05-20 NOTE — Telephone Encounter (Signed)
-----   Message from Kara Dies sent at 05/20/2023  7:02 AM EST ----- Please cal land inform pt: The X-rays showed mild degenerative changes. How is he doing now?

## 2023-05-20 NOTE — Telephone Encounter (Signed)
 Left message to call the office back regarding lab results below. Okay to give lab results.

## 2023-05-21 ENCOUNTER — Telehealth: Payer: Self-pay

## 2023-05-21 NOTE — Telephone Encounter (Signed)
 Left message to call the office back regarding his xray results and to see how he is doing? Okay to give results.

## 2023-05-21 NOTE — Telephone Encounter (Signed)
-----   Message from Kara Dies sent at 05/20/2023  7:02 AM EST ----- Please cal land inform pt: The X-rays showed mild degenerative changes. How is he doing now?

## 2023-05-22 ENCOUNTER — Telehealth: Payer: Self-pay

## 2023-05-22 ENCOUNTER — Encounter: Payer: Self-pay | Admitting: Nurse Practitioner

## 2023-05-22 ENCOUNTER — Ambulatory Visit: Payer: Medicaid Other | Admitting: Nurse Practitioner

## 2023-05-22 VITALS — BP 124/76 | HR 97 | Temp 97.9°F | Ht 68.0 in | Wt 321.2 lb

## 2023-05-22 DIAGNOSIS — G8929 Other chronic pain: Secondary | ICD-10-CM

## 2023-05-22 DIAGNOSIS — M545 Low back pain, unspecified: Secondary | ICD-10-CM | POA: Diagnosis not present

## 2023-05-22 DIAGNOSIS — J069 Acute upper respiratory infection, unspecified: Secondary | ICD-10-CM

## 2023-05-22 MED ORDER — AMOXICILLIN-POT CLAVULANATE 875-125 MG PO TABS: 1.0000 | ORAL_TABLET | Freq: Two times a day (BID) | ORAL | 0 refills | Status: AC

## 2023-05-22 MED ORDER — ALBUTEROL SULFATE (2.5 MG/3ML) 0.083% IN NEBU
2.5000 mg | INHALATION_SOLUTION | Freq: Once | RESPIRATORY_TRACT | Status: AC
  Administered 2023-05-22: 2.5 mg via RESPIRATORY_TRACT

## 2023-05-22 MED ORDER — ALBUTEROL SULFATE HFA 108 (90 BASE) MCG/ACT IN AERS: 2.0000 | INHALATION_SPRAY | Freq: Four times a day (QID) | RESPIRATORY_TRACT | 1 refills | Status: DC | PRN

## 2023-05-22 NOTE — Patient Instructions (Addendum)
Referral placed to Ortho for Back pain. Will start on Augmentin and albuterol
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
## 2023-05-22 NOTE — Telephone Encounter (Signed)
Barry Raymond placed a sleep study for this Patient

## 2023-05-22 NOTE — Telephone Encounter (Signed)
Barry Raymond placed a referral to Pulmonology back in 9/24 for a sleep study but the Patient stated today at his appointment that he has not heard anything from it. Barry Raymond was wondering if you could check on that?

## 2023-05-23 ENCOUNTER — Other Ambulatory Visit (HOSPITAL_COMMUNITY): Payer: Self-pay

## 2023-05-23 NOTE — Telephone Encounter (Signed)
Thank you :)

## 2023-05-25 DIAGNOSIS — J069 Acute upper respiratory infection, unspecified: Secondary | ICD-10-CM | POA: Insufficient documentation

## 2023-05-25 NOTE — Assessment & Plan Note (Signed)
Wheezing and cough for the past couple of weeks following a cold. No improvement with Mucinex. -Prescribe Augmentin and Albuterol inhaler. -Continue Mucinex. -Administer nebulizer treatment in office today and saturation improved to 98%.

## 2023-05-25 NOTE — Assessment & Plan Note (Signed)
Severe pain limiting mobility and ability to work. X-ray shows arthritis. Patient is unable to stand for more than 15 minutes without needing to sit or lean due to pain. -Refer to ortho for further management.

## 2023-05-30 ENCOUNTER — Telehealth: Payer: Self-pay

## 2023-05-30 NOTE — Telephone Encounter (Signed)
Copied from CRM [id number]. Topic: General - Other >> May 30, 2023  9:32 AM Barry Raymond wrote: Reason for CRM: Patient states he missed a call from Prince Solian, and states when she gets time he will be on stand by for her to call again.

## 2023-05-30 NOTE — Telephone Encounter (Signed)
Patient called back and I gave him the number [phone] to call and schedule for his back.

## 2023-05-30 NOTE — Telephone Encounter (Signed)
I have talked to the Patient when he called [id number] and they transferred him to me and I gave him the phone number to call and schedule so they do not close his referral.

## 2023-05-30 NOTE — Telephone Encounter (Signed)
Left message to call the office back regarding the referral for his back.

## 2023-06-07 DIAGNOSIS — Z419 Encounter for procedure for purposes other than remedying health state, unspecified: Secondary | ICD-10-CM | POA: Diagnosis not present

## 2023-06-19 ENCOUNTER — Ambulatory Visit: Payer: Medicaid Other | Admitting: Orthopedic Surgery

## 2023-07-03 ENCOUNTER — Ambulatory Visit (INDEPENDENT_AMBULATORY_CARE_PROVIDER_SITE_OTHER): Payer: Medicaid Other | Admitting: Orthopedic Surgery

## 2023-07-03 VITALS — BP 163/119 | HR 94 | Ht 68.0 in | Wt 326.0 lb

## 2023-07-03 DIAGNOSIS — M545 Low back pain, unspecified: Secondary | ICD-10-CM

## 2023-07-03 DIAGNOSIS — G8929 Other chronic pain: Secondary | ICD-10-CM
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
## 2023-07-04 ENCOUNTER — Encounter: Payer: Self-pay | Admitting: Orthopedic Surgery

## 2023-07-05 DIAGNOSIS — Z419 Encounter for procedure for purposes other than remedying health state, unspecified: Secondary | ICD-10-CM | POA: Diagnosis not present

## 2023-08-11 ENCOUNTER — Ambulatory Visit

## 2023-08-13 ENCOUNTER — Ambulatory Visit

## 2023-08-14 ENCOUNTER — Ambulatory Visit: Payer: Self-pay

## 2023-08-14 NOTE — Telephone Encounter (Signed)
 Pt called back and stated that he knew he could go to [facility].

## 2023-08-14 NOTE — Telephone Encounter (Signed)
 Called pt and vm is full unable to leave a message to call the office back.

## 2023-08-14 NOTE — Telephone Encounter (Addendum)
  Chief Complaint: back pain  Symptoms: pain  Frequency: comes and goes   Disposition: [] ED /[] Urgent Care (no appt availability in office) / [x] Appointment(In office/virtual)/ []  [facility]/ [] Home Care/ [] Refused Recommended Disposition /[]  Mobile Bus/ []  Follow-up with PCP Additional Notes: Pt calling with chronic lower back pain. Pt states the pain comes and goes and doesn't radiate. Pt denies any weakness in legs. Pt is requesting medication refill. Pt rated pain an 8 while walking. While sitting, pain is less. RN advised pt be seen today, but Pt refused appt today and tomorrow due to transportation issues. Pt is asking for appt Monday. Pt stated "I'll be fine until then." Pt has appt 4/14 @ 10. RN gave care advice and pt verbalized understanding.              Copied from CRM [id number]. Topic: Clinical - Red Word Triage >> Aug 14, 2023  9:23 AM Mackie Pai E wrote: [facility] that prompted transfer to Nurse Triage: Patient experiencing back pain. Patient stated that it always hurts, but hurts more when standing up. Patient rated the pain a level 10 out of 10. Reason for Disposition  [1] Age > 50 AND [2] no history of prior similar back pain  Answer Assessment - Initial Assessment Questions 1. ONSET: "When did the pain begin?"      Over a year 2. LOCATION: "Where does it hurt?" (upper, mid or lower back)     Lower back  3. SEVERITY: "How bad is the pain?"  (e.g., Scale 1-10; mild, moderate, or severe)   - MILD (1-3): Doesn't interfere with normal activities.    - MODERATE (4-7): Interferes with normal activities or awakens from sleep.    - SEVERE (8-10): Excruciating pain, unable to do any normal activities.      8 4. PATTERN: "Is the pain constant?" (e.g., yes, no; constant, intermittent)      Comes and goes 5. RADIATION: "Does the pain shoot into your legs or somewhere else?"     Denies  6. CAUSE:  "What do you think is causing the back pain?"      Not  sure  7. BACK OVERUSE:  "Any recent lifting of heavy objects, strenuous work or exercise?"     Not sure  8. MEDICINES: "What have you taken so far for the pain?" (e.g., nothing, acetaminophen, NSAIDS)     Prednisone, and muscle relaxers 9. NEUROLOGIC SYMPTOMS: "Do you have any weakness, numbness, or problems with bowel/bladder control?"     Denies  10. OTHER SYMPTOMS: "Do you have any other symptoms?" (e.g., fever, abdomen pain, burning with urination, blood in urine)       Denies  Protocols used: Back Pain-A-AH

## 2023-08-14 NOTE — Telephone Encounter (Signed)
 Additional Information . Commented on: [1] SEVERE back pain (e.g., excruciating, unable to do any normal activities) AND [2] not improved 2 hours after pain medicine    RN advised pt be seen today, but pt refused.  Protocols used: Back Pain-A-AH

## 2023-08-16 DIAGNOSIS — Z419 Encounter for procedure for purposes other than remedying health state, unspecified: Secondary | ICD-10-CM | POA: Diagnosis not present

## 2023-08-18 ENCOUNTER — Encounter

## 2023-08-18 ENCOUNTER — Ambulatory Visit: Admitting: Family Medicine

## 2023-08-19 ENCOUNTER — Ambulatory Visit

## 2023-08-20 ENCOUNTER — Ambulatory Visit

## 2023-08-21 ENCOUNTER — Emergency Department

## 2023-08-21 ENCOUNTER — Ambulatory Visit: Admitting: Family Medicine

## 2023-08-21 ENCOUNTER — Inpatient Hospital Stay
Admission: EM | Admit: 2023-08-21 | Discharge: 2023-08-26 | DRG: 638 | Disposition: A | Discharge status: Home / Self Care | Attending: Osteopathic Medicine | Admitting: Osteopathic Medicine

## 2023-08-21 ENCOUNTER — Other Ambulatory Visit: Payer: Self-pay

## 2023-08-21 DIAGNOSIS — Z6841 Body Mass Index (BMI) 40.0 and over, adult: Secondary | ICD-10-CM

## 2023-08-21 DIAGNOSIS — R079 Chest pain, unspecified: Secondary | ICD-10-CM | POA: Diagnosis present

## 2023-08-21 DIAGNOSIS — Z888 Allergy status to other drugs, medicaments and biological substances status: Secondary | ICD-10-CM

## 2023-08-21 DIAGNOSIS — E119 Type 2 diabetes mellitus without complications: Secondary | ICD-10-CM

## 2023-08-21 DIAGNOSIS — E66813 Obesity, class 3: Secondary | ICD-10-CM | POA: Diagnosis present

## 2023-08-21 DIAGNOSIS — R000 Tachycardia, unspecified: Secondary | ICD-10-CM | POA: Diagnosis not present

## 2023-08-21 DIAGNOSIS — E1010 Type 1 diabetes mellitus with ketoacidosis without coma: Secondary | ICD-10-CM | POA: Diagnosis not present

## 2023-08-21 DIAGNOSIS — G8929 Other chronic pain: Secondary | ICD-10-CM | POA: Diagnosis present

## 2023-08-21 DIAGNOSIS — Z791 Long term (current) use of non-steroidal anti-inflammatories (NSAID): Secondary | ICD-10-CM

## 2023-08-21 DIAGNOSIS — F1410 Cocaine abuse, uncomplicated: Secondary | ICD-10-CM | POA: Diagnosis present

## 2023-08-21 DIAGNOSIS — E1310 Other specified diabetes mellitus with ketoacidosis without coma: Principal | ICD-10-CM

## 2023-08-21 DIAGNOSIS — Z7982 Long term (current) use of aspirin: Secondary | ICD-10-CM

## 2023-08-21 DIAGNOSIS — R0789 Other chest pain: Secondary | ICD-10-CM | POA: Diagnosis not present

## 2023-08-21 DIAGNOSIS — Z720 Tobacco use: Secondary | ICD-10-CM | POA: Diagnosis not present

## 2023-08-21 DIAGNOSIS — M62838 Other muscle spasm: Secondary | ICD-10-CM | POA: Diagnosis present

## 2023-08-21 DIAGNOSIS — N400 Enlarged prostate without lower urinary tract symptoms: Secondary | ICD-10-CM | POA: Diagnosis present

## 2023-08-21 DIAGNOSIS — E1110 Type 2 diabetes mellitus with ketoacidosis without coma: Principal | ICD-10-CM | POA: Diagnosis present

## 2023-08-21 DIAGNOSIS — M549 Dorsalgia, unspecified: Secondary | ICD-10-CM | POA: Diagnosis present

## 2023-08-21 DIAGNOSIS — Z79899 Other long term (current) drug therapy: Secondary | ICD-10-CM

## 2023-08-21 DIAGNOSIS — G4730 Sleep apnea, unspecified: Secondary | ICD-10-CM | POA: Diagnosis present

## 2023-08-21 DIAGNOSIS — F17210 Nicotine dependence, cigarettes, uncomplicated: Secondary | ICD-10-CM | POA: Diagnosis present

## 2023-08-21 DIAGNOSIS — E0810 Diabetes mellitus due to underlying condition with ketoacidosis without coma: Secondary | ICD-10-CM

## 2023-08-21 DIAGNOSIS — E1165 Type 2 diabetes mellitus with hyperglycemia: Secondary | ICD-10-CM

## 2023-08-21 DIAGNOSIS — Z794 Long term (current) use of insulin: Secondary | ICD-10-CM

## 2023-08-21 DIAGNOSIS — K219 Gastro-esophageal reflux disease without esophagitis: Secondary | ICD-10-CM | POA: Diagnosis present

## 2023-08-21 DIAGNOSIS — J449 Chronic obstructive pulmonary disease, unspecified: Secondary | ICD-10-CM | POA: Diagnosis present

## 2023-08-21 DIAGNOSIS — Z8249 Family history of ischemic heart disease and other diseases of the circulatory system: Secondary | ICD-10-CM

## 2023-08-21 LAB — BASIC METABOLIC PANEL WITH GFR
Anion gap: 22 — ABNORMAL HIGH (ref 5–15)
BUN: 19 mg/dL (ref 6–20)
CO2: 14 mmol/L — ABNORMAL LOW (ref 22–32)
Calcium: 9.3 mg/dL (ref 8.9–10.3)
Chloride: 97 mmol/L — ABNORMAL LOW (ref 98–111)
Creatinine, Ser: 1.12 mg/dL (ref 0.61–1.24)
GFR, Estimated: >60 mL/min (ref >60)
Glucose, Bld: 394 mg/dL — ABNORMAL HIGH (ref 70–99)
Potassium: 3.9 mmol/L (ref 3.5–5.1)
Sodium: 133 mmol/L — ABNORMAL LOW (ref 135–145)

## 2023-08-21 LAB — BLOOD GAS, VENOUS
Acid-base deficit: 8.3 mmol/L — ABNORMAL HIGH (ref 0.0–2.0)
Bicarbonate: 18.4 mmol/L — ABNORMAL LOW (ref 20.0–28.0)
O2 Saturation: 67.4 %
Patient temperature: 37
pCO2, Ven: 41 mmHg — ABNORMAL LOW (ref 44–60)
pH, Ven: 7.26 (ref 7.25–7.43)
pO2, Ven: 42 mmHg (ref 32–45)

## 2023-08-21 LAB — CBC
HCT: 52.2 % — ABNORMAL HIGH (ref 39.0–52.0)
Hemoglobin: 17.9 g/dL — ABNORMAL HIGH (ref 13.0–17.0)
MCH: 29.8 pg (ref 26.0–34.0)
MCHC: 34.3 g/dL (ref 30.0–36.0)
MCV: 87 fL (ref 80.0–100.0)
Platelets: 228 10*3/uL (ref 150–400)
RBC: 6 MIL/uL — ABNORMAL HIGH (ref 4.22–5.81)
RDW: 12.3 % (ref 11.5–15.5)
WBC: 10.2 10*3/uL (ref 4.0–10.5)
nRBC: 0 % (ref 0.0–0.2)

## 2023-08-21 LAB — TROPONIN I (HIGH SENSITIVITY): Troponin I (High Sensitivity): 2 ng/L (ref <18)

## 2023-08-21 MED ORDER — POTASSIUM CHLORIDE 10 MEQ/100ML IV SOLN
10.0000 meq | INTRAVENOUS | Status: AC
  Filled 2023-08-21: qty 100

## 2023-08-21 MED ORDER — ONDANSETRON HCL 4 MG/2ML IJ SOLN
4.0000 mg | Freq: Three times a day (TID) | INTRAMUSCULAR | Status: DC | PRN
  Administered 2023-08-21 – 2023-08-25 (×9): 4 mg via INTRAVENOUS
  Filled 2023-08-21 (×9): qty 2

## 2023-08-21 MED ORDER — INSULIN REGULAR(HUMAN) IN NACL 100-0.9 UT/100ML-% IV SOLN: INTRAVENOUS | Status: DC

## 2023-08-21 MED ORDER — DEXTROSE 50 % IV SOLN: 0.0000 mL | INTRAVENOUS | Status: DC | PRN

## 2023-08-21 MED ORDER — OXYCODONE-ACETAMINOPHEN 5-325 MG PO TABS: 1.0000 | ORAL_TABLET | ORAL | Status: DC | PRN

## 2023-08-21 MED ORDER — ENOXAPARIN SODIUM 80 MG/0.8ML IJ SOSY
70.0000 mg | PREFILLED_SYRINGE | INTRAMUSCULAR | Status: DC
  Administered 2023-08-22 – 2023-08-26 (×2): 70 mg via SUBCUTANEOUS
  Filled 2023-08-21 (×3): qty 0.7
  Filled 2023-08-21: qty 0.8

## 2023-08-21 MED ORDER — DEXTROSE IN LACTATED RINGERS 5 % IV SOLN: INTRAVENOUS | Status: AC

## 2023-08-21 MED ORDER — ALBUTEROL SULFATE (2.5 MG/3ML) 0.083% IN NEBU: 2.5000 mg | INHALATION_SOLUTION | Freq: Four times a day (QID) | RESPIRATORY_TRACT | Status: DC | PRN

## 2023-08-21 MED ORDER — LACTATED RINGERS IV BOLUS
20.0000 mL/kg | Freq: Once | INTRAVENOUS | Status: AC
  Administered 2023-08-21: 2812 mL via INTRAVENOUS

## 2023-08-21 MED ORDER — LACTATED RINGERS IV SOLN: INTRAVENOUS | Status: DC

## 2023-08-21 MED ORDER — NITROGLYCERIN 0.4 MG SL SUBL: 0.4000 mg | SUBLINGUAL_TABLET | SUBLINGUAL | Status: DC | PRN

## 2023-08-21 MED ORDER — HYDRALAZINE HCL 20 MG/ML IJ SOLN: 5.0000 mg | INTRAMUSCULAR | Status: DC | PRN

## 2023-08-21 MED ORDER — NICOTINE 21 MG/24HR TD PT24
21.0000 mg | MEDICATED_PATCH | Freq: Every day | TRANSDERMAL | Status: DC
  Filled 2023-08-21 (×4): qty 1

## 2023-08-21 MED ORDER — DM-GUAIFENESIN ER 30-600 MG PO TB12: 1.0000 | ORAL_TABLET | Freq: Two times a day (BID) | ORAL | Status: DC | PRN

## 2023-08-21 MED ORDER — CYCLOBENZAPRINE HCL 10 MG PO TABS: 5.0000 mg | ORAL_TABLET | Freq: Three times a day (TID) | ORAL | Status: DC | PRN

## 2023-08-21 MED ORDER — DEXTROSE IN LACTATED RINGERS 5 % IV SOLN: INTRAVENOUS | Status: DC

## 2023-08-21 MED ORDER — ALBUTEROL SULFATE HFA 108 (90 BASE) MCG/ACT IN AERS: 2.0000 | INHALATION_SPRAY | Freq: Four times a day (QID) | RESPIRATORY_TRACT | Status: DC | PRN

## 2023-08-21 MED ORDER — ONDANSETRON HCL 4 MG/2ML IJ SOLN: 4.0000 mg | Freq: Once | INTRAMUSCULAR | Status: DC

## 2023-08-21 MED ORDER — ACETAMINOPHEN 325 MG PO TABS: 650.0000 mg | ORAL_TABLET | Freq: Four times a day (QID) | ORAL | Status: DC | PRN

## 2023-08-21 MED ORDER — INSULIN REGULAR(HUMAN) IN NACL 100-0.9 UT/100ML-% IV SOLN
INTRAVENOUS | Status: DC
  Administered 2023-08-22: 18 [IU]/h via INTRAVENOUS
  Administered 2023-08-22: 5.5 [IU]/h via INTRAVENOUS
  Administered 2023-08-22: 10.5 [IU]/h via INTRAVENOUS
  Filled 2023-08-21 (×3): qty 100

## 2023-08-21 MED ORDER — LACTATED RINGERS IV SOLN: INTRAVENOUS | Status: AC

## 2023-08-21 NOTE — ED Notes (Signed)
 IV team at bedside

## 2023-08-21 NOTE — ED Notes (Signed)
 Attempted to call report. Nurse not available at this time. Receiving nurse to return call for report.

## 2023-08-21 NOTE — Progress Notes (Signed)
 PHARMACIST - PHYSICIAN COMMUNICATION  CONCERNING:  Enoxaparin (Lovenox) for DVT Prophylaxis    RECOMMENDATION: Patient was prescribed enoxaprin 40mg  q24 hours for VTE prophylaxis.   Filed Weights   08/21/23 2001  Weight: (!) 140.6 kg (310 lb)    Body mass index is 47.14 kg/m.  Estimated Creatinine Clearance: 102.6 mL/min (by C-G formula based on SCr of 1.12 mg/dL).   Based on [facility] policy patient is candidate for enoxaparin 0.5mg /kg TBW SQ every 24 hours based on BMI being >30.  DESCRIPTION: Pharmacy has adjusted enoxaparin dose per [facility] policy.  Patient is now receiving enoxaparin 0.5 mg/kg every 24 hours   Coretta Dexter, PharmD, [facility] 08/21/2023 10:57 PM
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
## 2023-08-21 NOTE — ED Provider Notes (Addendum)
 [facility] Provider Note    Event Date/Time   First MD Initiated Contact with Patient 08/21/23 2237     (approximate)   History   Chest Pain   HPI  Barry Raymond is a 58 y.o. male who denies any history of diabetes who comes in with concerns for increased thirst, nausea vomiting.  He reports some chest pain in triage but to me he actually reports more nausea and vomiting.  He denies any significant chest pain or shortness of breath at this time contrary to triage note.  He denies ever being told that he was diabetic.   Physical Exam   Triage Vital Signs: ED Triage Vitals  Encounter Vitals Group     BP 08/21/23 2002 (!) 147/86     Systolic BP Percentile --      Diastolic BP Percentile --      Pulse Rate 08/21/23 2002 100     Resp 08/21/23 2002 20     Temp 08/21/23 2002 97.9 F (36.6 C)     Temp src --      SpO2 08/21/23 2002 100 %     Weight 08/21/23 2001 (!) 310 lb (140.6 kg)     Height 08/21/23 2001 5\' 8"  (1.727 m)     Head Circumference --      Peak Flow --      Pain Score 08/21/23 2001 7     Pain Loc --      Pain Education --      Exclude from Growth Chart --     Most recent vital signs: Vitals:   08/21/23 2002  BP: (!) 147/86  Pulse: 100  Resp: 20  Temp: 97.9 F (36.6 C)  SpO2: 100%     General: Awake, no distress.  CV:  Good peripheral perfusion.  Resp:  Normal effort.  Abd:  No distention.  Soft and nontender Other:  No swelling in legs.  No calf tenderness Umbilical hernia- baseline per patient, soft denies any pain on it.   ED Results / Procedures / Treatments   Labs (all labs ordered are listed, but only abnormal results are displayed) Labs Reviewed  BASIC METABOLIC PANEL WITH GFR - Abnormal; Notable for the following components:      Result Value   Sodium 133 (*)    Chloride 97 (*)    CO2 14 (*)    Glucose, Bld 394 (*)    Anion gap 22 (*)    All other components within normal limits  CBC - Abnormal; Notable  for the following components:   RBC 6.00 (*)    Hemoglobin 17.9 (*)    HCT 52.2 (*)    All other components within normal limits  BLOOD GAS, VENOUS - Abnormal; Notable for the following components:   pCO2, Ven 41 (*)    Bicarbonate 18.4 (*)    Acid-base deficit 8.3 (*)    All other components within normal limits  URINALYSIS, ROUTINE W REFLEX MICROSCOPIC  BETA-HYDROXYBUTYRIC ACID  HEMOGLOBIN A1C  LIPID PANEL  URINE DRUG SCREEN, QUALITATIVE ([facility] ONLY)  HIV ANTIBODY (ROUTINE TESTING W REFLEX)  BASIC METABOLIC PANEL WITH GFR  BASIC METABOLIC PANEL WITH GFR  BASIC METABOLIC PANEL WITH GFR  BASIC METABOLIC PANEL WITH GFR  TROPONIN I (HIGH SENSITIVITY)  TROPONIN I (HIGH SENSITIVITY)     EKG  My interpretation of EKG:  Sinus tachycardia rate of 102 without any ST elevation, T wave version lead III, normal intervals  RADIOLOGY  I have reviewed the xray personally and interpreted no pneumonia   PROCEDURES:  Critical Care performed: Yes, see critical care procedure note(s)  .1-3 Lead EKG Interpretation  Performed by: Lubertha Rush, MD Authorized by: Lubertha Rush, MD     Interpretation: normal     ECG rate:  90   ECG rate assessment: normal     Rhythm: sinus rhythm     Ectopy: none     Conduction: normal   .Critical Care  Performed by: Lubertha Rush, MD Authorized by: Lubertha Rush, MD   Critical care provider statement:    Critical care time (minutes):  30   Critical care was necessary to treat or prevent imminent or life-threatening deterioration of the following conditions:  Endocrine crisis   Critical care was time spent personally by me on the following activities:  Development of treatment plan with patient or surrogate, discussions with consultants, evaluation of patient's response to treatment, examination of patient, ordering and review of laboratory studies, ordering and review of radiographic studies, ordering and performing treatments and interventions, pulse  oximetry, re-evaluation of patient's condition and review of old charts    MEDICATIONS ORDERED IN ED: Medications  insulin regular, human (MYXREDLIN) 100 units/ 100 mL infusion (has no administration in time range)  lactated ringers infusion (has no administration in time range)  dextrose 5 % in lactated ringers infusion (has no administration in time range)  potassium chloride 10 mEq in 100 mL IVPB (has no administration in time range)  ondansetron (ZOFRAN) injection 4 mg (4 mg Intravenous Given 08/21/23 2307)  hydrALAZINE (APRESOLINE) injection 5 mg (has no administration in time range)  acetaminophen (TYLENOL) tablet 650 mg (has no administration in time range)  nicotine (NICODERM CQ - dosed in mg/24 hours) patch 21 mg (has no administration in time range)  nitroGLYCERIN (NITROSTAT) SL tablet 0.4 mg (has no administration in time range)  oxyCODONE-acetaminophen (PERCOCET/ROXICET) 5-325 MG per tablet 1 tablet (has no administration in time range)  enoxaparin (LOVENOX) injection 70 mg (has no administration in time range)  dextrose 50 % solution 0-50 mL (has no administration in time range)  lactated ringers bolus 2,812 mL (2,812 mLs Intravenous New Bag/Given 08/21/23 2258)     IMPRESSION / MDM / ASSESSMENT AND PLAN / ED COURSE  I reviewed the triage vital signs and the nursing notes.   Patient's presentation is most consistent with acute presentation with potential threat to life or bodily function.   Differential ACS, myocarditis, pneumothorax, pneumonia, hyperglycemia.  Considered PE but patient denied any shortness of breath to me no evidence of DVT on examination  Patient's labs consistent with new onset diabetes with low bicarb which I suspect is related to DKA.  Patient given some IV fluids and given some IV potassium and will be started on insulin infusion.  Troponin is negative unlikely ACS.  CBC stable hemoglobin.  Will discuss with hospital team for admission.  The patient  is on the cardiac monitor to evaluate for evidence of arrhythmia and/or significant heart rate changes.      FINAL CLINICAL IMPRESSION(S) / ED DIAGNOSES   Final diagnoses:  Diabetic ketoacidosis without coma associated with other specified diabetes mellitus (HCC)     Rx / DC Orders   ED Discharge Orders     None        Note:  This document was prepared using Dragon voice recognition software and may include unintentional dictation errors.   Lubertha Rush, MD 08/21/23  2317    Lubertha Rush, MD 08/21/23 [phone]

## 2023-08-21 NOTE — ED Triage Notes (Signed)
 Pt reports intermittent chest pain that began this morning. Pt reports pain is sharp and does not radiate. Pt denies cardiac hx.  States he also feels short of breath

## 2023-08-21 NOTE — ED Notes (Signed)
 Report called to [place], RN regarding pt admission.

## 2023-08-22 ENCOUNTER — Telehealth (HOSPITAL_COMMUNITY): Payer: Self-pay | Admitting: Pharmacy Technician

## 2023-08-22 ENCOUNTER — Other Ambulatory Visit (HOSPITAL_COMMUNITY): Payer: Self-pay

## 2023-08-22 DIAGNOSIS — G4730 Sleep apnea, unspecified: Secondary | ICD-10-CM | POA: Diagnosis not present

## 2023-08-22 DIAGNOSIS — R0789 Other chest pain: Secondary | ICD-10-CM | POA: Diagnosis not present

## 2023-08-22 DIAGNOSIS — E1165 Type 2 diabetes mellitus with hyperglycemia: Secondary | ICD-10-CM

## 2023-08-22 DIAGNOSIS — K429 Umbilical hernia without obstruction or gangrene: Secondary | ICD-10-CM | POA: Diagnosis not present

## 2023-08-22 DIAGNOSIS — E119 Type 2 diabetes mellitus without complications: Secondary | ICD-10-CM

## 2023-08-22 DIAGNOSIS — Z794 Long term (current) use of insulin: Secondary | ICD-10-CM | POA: Diagnosis not present

## 2023-08-22 DIAGNOSIS — M62838 Other muscle spasm: Secondary | ICD-10-CM | POA: Diagnosis not present

## 2023-08-22 DIAGNOSIS — G8929 Other chronic pain: Secondary | ICD-10-CM | POA: Diagnosis not present

## 2023-08-22 DIAGNOSIS — Z8249 Family history of ischemic heart disease and other diseases of the circulatory system: Secondary | ICD-10-CM | POA: Diagnosis not present

## 2023-08-22 DIAGNOSIS — F1410 Cocaine abuse, uncomplicated: Secondary | ICD-10-CM | POA: Diagnosis not present

## 2023-08-22 DIAGNOSIS — F17210 Nicotine dependence, cigarettes, uncomplicated: Secondary | ICD-10-CM | POA: Diagnosis not present

## 2023-08-22 DIAGNOSIS — E1110 Type 2 diabetes mellitus with ketoacidosis without coma: Secondary | ICD-10-CM

## 2023-08-22 DIAGNOSIS — K760 Fatty (change of) liver, not elsewhere classified: Secondary | ICD-10-CM | POA: Diagnosis not present

## 2023-08-22 DIAGNOSIS — Z888 Allergy status to other drugs, medicaments and biological substances status: Secondary | ICD-10-CM | POA: Diagnosis not present

## 2023-08-22 DIAGNOSIS — R079 Chest pain, unspecified: Secondary | ICD-10-CM | POA: Diagnosis not present

## 2023-08-22 DIAGNOSIS — K219 Gastro-esophageal reflux disease without esophagitis: Secondary | ICD-10-CM | POA: Diagnosis present

## 2023-08-22 DIAGNOSIS — M549 Dorsalgia, unspecified: Secondary | ICD-10-CM | POA: Diagnosis not present

## 2023-08-22 DIAGNOSIS — K439 Ventral hernia without obstruction or gangrene: Secondary | ICD-10-CM | POA: Diagnosis not present

## 2023-08-22 DIAGNOSIS — J449 Chronic obstructive pulmonary disease, unspecified: Secondary | ICD-10-CM | POA: Diagnosis not present

## 2023-08-22 DIAGNOSIS — E1310 Other specified diabetes mellitus with ketoacidosis without coma: Secondary | ICD-10-CM | POA: Diagnosis not present

## 2023-08-22 DIAGNOSIS — N400 Enlarged prostate without lower urinary tract symptoms: Secondary | ICD-10-CM | POA: Diagnosis not present

## 2023-08-22 DIAGNOSIS — Z6841 Body Mass Index (BMI) 40.0 and over, adult: Secondary | ICD-10-CM | POA: Diagnosis not present

## 2023-08-22 DIAGNOSIS — Z7982 Long term (current) use of aspirin: Secondary | ICD-10-CM | POA: Diagnosis not present

## 2023-08-22 DIAGNOSIS — R000 Tachycardia, unspecified: Secondary | ICD-10-CM | POA: Diagnosis not present

## 2023-08-22 DIAGNOSIS — E66813 Obesity, class 3: Secondary | ICD-10-CM | POA: Diagnosis not present

## 2023-08-22 DIAGNOSIS — Z79899 Other long term (current) drug therapy: Secondary | ICD-10-CM | POA: Diagnosis not present

## 2023-08-22 DIAGNOSIS — Z791 Long term (current) use of non-steroidal anti-inflammatories (NSAID): Secondary | ICD-10-CM | POA: Diagnosis not present

## 2023-08-22 LAB — GLUCOSE, CAPILLARY
Glucose-Capillary: 144 mg/dL — ABNORMAL HIGH (ref 70–99)
Glucose-Capillary: 156 mg/dL — ABNORMAL HIGH (ref 70–99)
Glucose-Capillary: 156 mg/dL — ABNORMAL HIGH (ref 70–99)
Glucose-Capillary: 163 mg/dL — ABNORMAL HIGH (ref 70–99)
Glucose-Capillary: 167 mg/dL — ABNORMAL HIGH (ref 70–99)
Glucose-Capillary: 169 mg/dL — ABNORMAL HIGH (ref 70–99)
Glucose-Capillary: 176 mg/dL — ABNORMAL HIGH (ref 70–99)
Glucose-Capillary: 177 mg/dL — ABNORMAL HIGH (ref 70–99)
Glucose-Capillary: 180 mg/dL — ABNORMAL HIGH (ref 70–99)
Glucose-Capillary: 182 mg/dL — ABNORMAL HIGH (ref 70–99)
Glucose-Capillary: 187 mg/dL — ABNORMAL HIGH (ref 70–99)
Glucose-Capillary: 188 mg/dL — ABNORMAL HIGH (ref 70–99)
Glucose-Capillary: 189 mg/dL — ABNORMAL HIGH (ref 70–99)
Glucose-Capillary: 197 mg/dL — ABNORMAL HIGH (ref 70–99)
Glucose-Capillary: 201 mg/dL — ABNORMAL HIGH (ref 70–99)
Glucose-Capillary: 202 mg/dL — ABNORMAL HIGH (ref 70–99)
Glucose-Capillary: 215 mg/dL — ABNORMAL HIGH (ref 70–99)
Glucose-Capillary: 235 mg/dL — ABNORMAL HIGH (ref 70–99)
Glucose-Capillary: 239 mg/dL — ABNORMAL HIGH (ref 70–99)
Glucose-Capillary: 306 mg/dL — ABNORMAL HIGH (ref 70–99)
Glucose-Capillary: 313 mg/dL — ABNORMAL HIGH (ref 70–99)
Glucose-Capillary: 451 mg/dL — ABNORMAL HIGH (ref 70–99)

## 2023-08-22 LAB — BASIC METABOLIC PANEL WITH GFR
Anion gap: 21 — ABNORMAL HIGH (ref 5–15)
Anion gap: 15 (ref 5–15)
Anion gap: 13 (ref 5–15)
Anion gap: 10 (ref 5–15)
Anion gap: 12 (ref 5–15)
Anion gap: 13 (ref 5–15)
BUN: 20 mg/dL (ref 6–20)
BUN: 18 mg/dL (ref 6–20)
BUN: 17 mg/dL (ref 6–20)
BUN: 17 mg/dL (ref 6–20)
BUN: 19 mg/dL (ref 6–20)
BUN: 19 mg/dL (ref 6–20)
CO2: 14 mmol/L — ABNORMAL LOW (ref 22–32)
CO2: 17 mmol/L — ABNORMAL LOW (ref 22–32)
CO2: 20 mmol/L — ABNORMAL LOW (ref 22–32)
CO2: 23 mmol/L (ref 22–32)
CO2: 21 mmol/L — ABNORMAL LOW (ref 22–32)
CO2: 22 mmol/L (ref 22–32)
Calcium: 9.5 mg/dL (ref 8.9–10.3)
Calcium: 9.6 mg/dL (ref 8.9–10.3)
Calcium: 9.6 mg/dL (ref 8.9–10.3)
Calcium: 9.4 mg/dL (ref 8.9–10.3)
Calcium: 9.7 mg/dL (ref 8.9–10.3)
Calcium: 9.4 mg/dL (ref 8.9–10.3)
Chloride: 99 mmol/L (ref 98–111)
Chloride: 103 mmol/L (ref 98–111)
Chloride: 104 mmol/L (ref 98–111)
Chloride: 105 mmol/L (ref 98–111)
Chloride: 107 mmol/L (ref 98–111)
Chloride: 106 mmol/L (ref 98–111)
Creatinine, Ser: 1.21 mg/dL (ref 0.61–1.24)
Creatinine, Ser: 1.14 mg/dL (ref 0.61–1.24)
Creatinine, Ser: 1.04 mg/dL (ref 0.61–1.24)
Creatinine, Ser: 1.04 mg/dL (ref 0.61–1.24)
Creatinine, Ser: 0.97 mg/dL (ref 0.61–1.24)
Creatinine, Ser: 1.02 mg/dL (ref 0.61–1.24)
GFR, Estimated: >60 mL/min (ref >60)
GFR, Estimated: >60 mL/min (ref >60)
GFR, Estimated: >60 mL/min (ref >60)
GFR, Estimated: >60 mL/min (ref >60)
GFR, Estimated: >60 mL/min (ref >60)
GFR, Estimated: >60 mL/min (ref >60)
Glucose, Bld: 371 mg/dL — ABNORMAL HIGH (ref 70–99)
Glucose, Bld: 179 mg/dL — ABNORMAL HIGH (ref 70–99)
Glucose, Bld: 190 mg/dL — ABNORMAL HIGH (ref 70–99)
Glucose, Bld: 160 mg/dL — ABNORMAL HIGH (ref 70–99)
Glucose, Bld: 158 mg/dL — ABNORMAL HIGH (ref 70–99)
Glucose, Bld: 164 mg/dL — ABNORMAL HIGH (ref 70–99)
Potassium: 4.1 mmol/L (ref 3.5–5.1)
Potassium: 3.7 mmol/L (ref 3.5–5.1)
Potassium: 4 mmol/L (ref 3.5–5.1)
Potassium: 3.6 mmol/L (ref 3.5–5.1)
Potassium: 3.8 mmol/L (ref 3.5–5.1)
Potassium: 3.6 mmol/L (ref 3.5–5.1)
Sodium: 134 mmol/L — ABNORMAL LOW (ref 135–145)
Sodium: 135 mmol/L (ref 135–145)
Sodium: 137 mmol/L (ref 135–145)
Sodium: 138 mmol/L (ref 135–145)
Sodium: 140 mmol/L (ref 135–145)
Sodium: 141 mmol/L (ref 135–145)

## 2023-08-22 LAB — TROPONIN I (HIGH SENSITIVITY)
Troponin I (High Sensitivity): 4 ng/L (ref <18)
Troponin I (High Sensitivity): 4 ng/L (ref <18)

## 2023-08-22 LAB — URINALYSIS, ROUTINE W REFLEX MICROSCOPIC
Bacteria, UA: NONE SEEN
Bilirubin Urine: NEGATIVE
Glucose, UA: >=500 mg/dL — AB
Ketones, ur: 80 mg/dL — AB
Leukocytes,Ua: NEGATIVE
Nitrite: NEGATIVE
Protein, ur: 30 mg/dL — AB
RBC / HPF: 0 RBC/hpf (ref 0–5)
Specific Gravity, Urine: 1.028 (ref 1.005–1.030)
Squamous Epithelial / HPF: 0 /HPF (ref 0–5)
pH: 5 (ref 5.0–8.0)

## 2023-08-22 LAB — HEMOGLOBIN A1C
Hgb A1c MFr Bld: 12.4 % — ABNORMAL HIGH (ref 4.8–5.6)
Mean Plasma Glucose: 309.18 mg/dL

## 2023-08-22 LAB — LIPID PANEL
Cholesterol: 230 mg/dL — ABNORMAL HIGH (ref 0–200)
HDL: 34 mg/dL — ABNORMAL LOW (ref >40)
LDL Cholesterol: 151 mg/dL — ABNORMAL HIGH (ref 0–99)
Total CHOL/HDL Ratio: 6.8 ratio
Triglycerides: 223 mg/dL — ABNORMAL HIGH (ref <150)
VLDL: 45 mg/dL — ABNORMAL HIGH (ref 0–40)

## 2023-08-22 LAB — BETA-HYDROXYBUTYRIC ACID
Beta-Hydroxybutyric Acid: 7.44 mmol/L — ABNORMAL HIGH (ref 0.05–0.27)
Beta-Hydroxybutyric Acid: 2.13 mmol/L — ABNORMAL HIGH (ref 0.05–0.27)

## 2023-08-22 LAB — MRSA NEXT GEN BY PCR, NASAL: MRSA by PCR Next Gen: NOT DETECTED

## 2023-08-22 LAB — URINE DRUG SCREEN, QUALITATIVE (ARMC ONLY)
Amphetamines, Ur Screen: NOT DETECTED
Barbiturates, Ur Screen: NOT DETECTED
Benzodiazepine, Ur Scrn: NOT DETECTED
Cannabinoid 50 Ng, Ur ~~LOC~~: NOT DETECTED
Cocaine Metabolite,Ur ~~LOC~~: POSITIVE — AB
MDMA (Ecstasy)Ur Screen: NOT DETECTED
Methadone Scn, Ur: NOT DETECTED
Opiate, Ur Screen: NOT DETECTED
Phencyclidine (PCP) Ur S: NOT DETECTED
Tricyclic, Ur Screen: NOT DETECTED

## 2023-08-22 LAB — HIV ANTIBODY (ROUTINE TESTING W REFLEX): HIV Screen 4th Generation wRfx: NONREACTIVE

## 2023-08-22 LAB — CBG MONITORING, ED: Glucose-Capillary: 396 mg/dL — ABNORMAL HIGH (ref 70–99)

## 2023-08-22 MED ORDER — ALUM & MAG HYDROXIDE-SIMETH 200-200-20 MG/5ML PO SUSP
30.0000 mL | ORAL | Status: DC | PRN
  Administered 2023-08-22 – 2023-08-24 (×2): 30 mL via ORAL
  Filled 2023-08-22 (×2): qty 30

## 2023-08-22 MED ORDER — ASPIRIN 81 MG PO TBEC
81.0000 mg | DELAYED_RELEASE_TABLET | Freq: Every day | ORAL | Status: DC
  Administered 2023-08-22 – 2023-08-26 (×4): 81 mg via ORAL
  Filled 2023-08-22 (×4): qty 1

## 2023-08-22 MED ORDER — INSULIN STARTER KIT- PEN NEEDLES (ENGLISH)
1.0000 | Freq: Once | Status: AC
  Administered 2023-08-22: 1
  Filled 2023-08-22: qty 1

## 2023-08-22 MED ORDER — LIVING WELL WITH DIABETES BOOK
Freq: Once | Status: AC
  Filled 2023-08-22: qty 1

## 2023-08-22 MED ORDER — ALUM & MAG HYDROXIDE-SIMETH 200-200-20 MG/5 ML NICU TOPICAL: 1.0000 | TOPICAL | Status: DC | PRN

## 2023-08-22 MED ORDER — POTASSIUM CHLORIDE 10 MEQ/100ML IV SOLN
10.0000 meq | INTRAVENOUS | Status: AC
  Administered 2023-08-22 (×2): 10 meq via INTRAVENOUS
  Filled 2023-08-22 (×2): qty 100

## 2023-08-22 MED ORDER — SODIUM CHLORIDE 0.9 % IV SOLN
12.5000 mg | Freq: Four times a day (QID) | INTRAVENOUS | Status: DC | PRN
  Administered 2023-08-22 – 2023-08-25 (×8): 12.5 mg via INTRAVENOUS
  Filled 2023-08-22 (×2): qty 0.5
  Filled 2023-08-22 (×3): qty 12.5
  Filled 2023-08-22: qty 0.5
  Filled 2023-08-22 (×2): qty 12.5

## 2023-08-22 MED ORDER — PANTOPRAZOLE SODIUM 40 MG PO TBEC
40.0000 mg | DELAYED_RELEASE_TABLET | Freq: Every day | ORAL | Status: DC
  Administered 2023-08-22 – 2023-08-26 (×4): 40 mg via ORAL
  Filled 2023-08-22 (×4): qty 1

## 2023-08-22 NOTE — Telephone Encounter (Signed)
 Pharmacy Patient Advocate Encounter   Received notification from Inpatient Request that prior authorization for FreeStyle Libre 3 Plus Sensor is required/requested.   Insurance verification completed.   The patient is insured through [facility] [place] .   Per test claim: PA required; PA submitted to above mentioned insurance via CoverMyMeds Key/confirmation #/EOC [id number] Status is pending

## 2023-08-22 NOTE — Hospital Course (Addendum)
 Hospital course / significant events:   HPI: Barry Raymond is a 58 y.o. male with medical history significant of tobacco abuse, cocaine abuse, morbid obesity, COPD, GERD, BPH, chronic back pain, who presents with polyuria, polydipsia, generalized weakness, nausea, vomiting, chest pain.   Of note, last A1C 01/2023 was 6.4/prediabetic.   04/17: to ED. (+)DKA. Admitted to hospitalist service.  04/18: remains on insulin  drip, BHB still high into this afternoon, continue per DKA protocol on insulin  drip until BHB normalized but AG is improving and Glc has been stable 04/19: off insulin  drip and onto sq, advancing diet as tolerated, continues to have significant nausea but reports it's a bit better today  04/20: still nauseous, not taking po.  04/21: still nauseous, not taking po. CT abd/pelv ordered    Consultants:  none  Procedures/Surgeries: none     ASSESSMENT & PLAN:   Diabetic Ketoacidosis DKA resolved, remains hyperglycemic BMP and Beta Hydroxybutyrate repeat in AM increase long acting insulin  semglee  sliding scale novolog  q4h until tolerating diet then can do achs  Plan stop fluids once tolerating po diet  Persistent nausea, abdominal discomfort, hiccups  Scopolamine  patch in place Zofran  prn / pre-meal phenergan  for breakthrough nausea  CT abd/pelvis pending   Chest pain - resolved Have ruled out ACS ASA 81 mg daily As needed nitroglycerin , Percocet for pain   COPD (chronic obstructive pulmonary disease) Stable as needed albuterol  and Mucinex    Chronic back pain As needed Percocet, Tylenol  for pain Flexeril  for muscle spasm   Tobacco abuse and Cocaine abuse Nicotine  patch Counseling about importance of quitting substance use    Class 3 obesity based on BMI: Body mass index is 44.08 kg/m.  Underweight - under 18  overweight - 25 to 29 obese - 30 or more Class 1 obesity: BMI of 30.0 to 34 Class 2 obesity: BMI of 35.0 to 39 Class 3 obesity: BMI of 40.0 to  49 Super Morbid Obesity: BMI 50-59 Super-super Morbid Obesity: BMI 60+ Significantly low or high BMI is associated with higher medical risk.  Weight management advised as adjunct to other disease management and risk reduction treatments    DVT prophylaxis: lovenox  - pt is refusing this  IV fluids: D5LR continuous IV fluids as above can d/c once eating Nutrition: NPO pending off insulin  drip --> advance diet as toelrated  Central lines / other devices: none  Code Status: FULL CODE ACP documentation reviewed: none on file in [facility]  TOC needs: TBD but expect home Medical barriers to dispo: pesistent nausea and not tolerating po

## 2023-08-22 NOTE — Plan of Care (Signed)
  Problem: Education: Goal: Ability to describe self-care measures that may prevent or decrease complications (Diabetes Survival Skills Education) will improve Outcome: Progressing Goal: Individualized Educational Video(s) Outcome: Progressing   Prob

## 2023-08-22 NOTE — Plan of Care (Signed)

## 2023-08-22 NOTE — Telephone Encounter (Signed)
 Pharmacy Patient Advocate Encounter  Received notification from [facility] Medicaid that Prior Authorization for FreeStyle Libre 3 Plus Sensor  has been APPROVED from 08/22/2023 to 08/21/2024. Ran test claim, Copay is $0.00. This test claim was processed through [facility]- copay amounts may vary at other pharmacies due to pharmacy/plan contracts, or as the patient moves through the different stages of their insurance plan.   PA #/Case ID/Reference #: [id number]

## 2023-08-22 NOTE — Inpatient Diabetes Management (Addendum)
 Met w/ pt at bedside this AM.  SO at bedside as well but was sleeping during our visit.  Spoke with pt about new diagnosis.  Discussed A1C results with him and explained what an A1C is, basic pathophysiology of DM Type 2, basic home care, basic diabetes diet nutrition principles, importance of checking CBGs and maintaining good CBG control to prevent long-term and short-term complications.  Reviewed signs and symptoms of hyperglycemia and hypoglycemia and how to treat hypoglycemia at home.  Also reviewed blood sugar goals and A1c goals for home.  Discussed w/ pt that he will need close f/u with PCP right after d/c and that he may need to see PCP at least Q3 mos until CBGs are more stable.  Pt agreeable.  Also discussed with patient diagnosis of DKA (pathophysiology), treatment of DKA, lab results, and transition plan to SQ insulin  regimen.  Also discussed DM diet information with patient.  Encouraged patient to avoid beverages with sugar (regular soda, sweet tea, lemonade, fruit juice) and to consume mostly water.  Discussed what foods contain carbohydrates and how carbohydrates affect the body's blood sugar levels.  Encouraged patient to be careful with his portion sizes (especially grains, starchy vegetables, and fruits).  Explained to patient that men should have 60-75 grams of carbohydrates per meal per day.  Discussed with pt that he will likely need insulin  for home.  We also talked about checking CBGs at home and I asked pt if he had any interest in CGM.  Explained what CGM is, how it differs from fingerstick CBG, etc.  Pt stated interest but does not have cell phone with internet capability.  Would need a Reader to use the CGM at home.  While I was in the room, pt began to have hiccups and complained of oncoming nausea nausea again.  RN in the room and was aware.  Discussed with pt that I will come back later today to show/demo insulin  pen.  Briefly discussed with pt that he will likely need  insulin  for home given he was admitted w/ DKA and his A1c is >12%.  Pt stated he was OK using insulin  if needed--he just wants to feel better.     Addendum 2:15pm--Went back to see pt this afternoon.  He was sleeping soundly and could not keep his eyes open to talk with me.  Received nausea meds earlier due to ongoing nausea and vomiting--Per SO at bedside he just vomited not long before I arrived to room.  Gave pt's SO a sample of the Barry Raymond 3 plus glucose sensor along with an educational brochure.  I had discussed CGM with pt earlier and he seemed interested.  PA has been approved.  He will need a reader to use the FSL3 sensor at home.  Will request Rxs for sensors and reader at time of d/c.  Pt was unable to wake up and receive insulin  pen education.  Reviewed insulin  pen use with SO at bedside.  SO told me she watched her Dad give insulin .  Educated SO on insulin  pen use at home.  Reviewed all steps of insulin  pen including attachment of needle, 2-unit air shot, dialing up dose, giving injection, rotation of injection sites, removing needle, disposal of sharps, storage of unused insulin , disposal of insulin  etc.  Patient's SO able to provide successful return demonstration.  Reviewed troubleshooting with insulin  pen.  Will ask RN's to work with pt on insulin  injection over the weekend.  Insulin  edu brochure attached to AVS  as well.      --Will follow patient during hospitalization--  Langston Pippins RN, MSN, CDCES Diabetes Coordinator Inpatient Glycemic Control Team Team Pager: [phone] (8a-5p)

## 2023-08-22 NOTE — Progress Notes (Signed)
  RD consulted for nutrition education regarding diabetes.   Lab Results  Component Value Date   HGBA1C 12.4 (H) 08/21/2023   PTA DM medications are none.   Labs reviewed: CBGS: 156-235 (inpatient orders for glycemic control are IV insulin  drip).    Pt with new DM diagnosis. Pt will likely need insulin  at discharge. DM coordinator visited today and plans to return to teach pt about insulin  pen use.   RD provided Carbohydrate Counting for People with Diabetes and Plate Method  handouts from the Academy of Nutrition and Dietetics. Attached to AVS/ discharge summary.   Pt sleeping soundly at time of visit. Noted pt was just medicated for severe nausea.   RD also confirmed order for [facility].   Body mass index is 44.08 kg/m. Pt meets criteria for obesity, class III based on current BMI.  Current diet order is NPO, patient is consuming approximately n/a% of meals at this time. Labs and medications reviewed. No further nutrition interventions warranted at this time. RD contact information provided. If additional nutrition issues arise, please re-consult RD.  Margery ORN, RD, LDN, CDCES Registered Dietitian III Certified Diabetes Care and Education Specialist If unable to reach this RD, please use RD Inpatient group chat on secure chat between hours of 8am-4 pm daily

## 2023-08-22 NOTE — Discharge Instructions (Addendum)
 Plate Method for Diabetes   Foods with carbohydrates make your blood glucose level go up. The plate method is a simple way to meal plan and control the amount of carbohydrate you eat.         Use the following guidance to build a healthy plate to control carbohydrates. Divide a 9-inch plate into 3 sections, and consider your beverage the 4th section of your meal: Food Group Examples of Foods/Beverages for This Section of your Meal  Section 1: Non-starchy vegetables Fill  of your plate to include non-starchy vegetables Asparagus, broccoli, brussels sprouts, cabbage, carrots, cauliflower, celery, cucumber, green beans, mushrooms, peppers, salad greens, tomatoes, or zucchini.  Section 2: Protein foods Fill  of your plate to include a lean protein Lean meat, poultry, fish, seafood, cheese, eggs, lean deli meat, tofu, beans, lentils, nuts or nut butters.  Section 3: Carbohydrate foods Fill  of your plate to include carbohydrate foods Whole grains, whole wheat bread, brown rice, whole grain pasta, polenta, corn tortillas, fruit, or starchy vegetables (potatoes, green peas, corn, beans, acorn squash, and butternut squash). One cup of milk also counts as a food that contains carbohydrate.  Section 4: Beverage Choose water or a low-calorie drink for your beverage. Unsweetened tea, coffee, or flavored/sparkling water without added sugar.  Image reprinted with permission from The American Diabetes Association.  Copyright 2022 by the American Diabetes Association.   Copyright 2022  Academy of Nutrition and Dietetics. All rights reserved    Carbohydrate Counting For People With Diabetes  Foods with carbohydrates make your blood glucose level go up. Learning how to count carbohydrates can help you control your blood glucose levels. First, identify the foods you eat that contain carbohydrates. Then, using the Foods with Carbohydrates chart, determine about how much carbohydrates are in your meals and  snacks. Make sure you are eating foods with fiber, protein, and healthy fat along with your carbohydrate foods. Foods with Carbohydrates The following table shows carbohydrate foods that have about 15 grams of carbohydrate each. Using measuring cups, spoons, or a food scale when you first begin learning about carbohydrate counting can help you learn about the portion sizes you typically eat. The following foods have 15 grams carbohydrate each:  Grains 1 slice bread (1 ounce)  1 small tortilla (6-inch size)   large bagel (1 ounce)  1/3 cup pasta or rice (cooked)   hamburger or hot dog bun ( ounce)   cup cooked cereal   to  cup ready-to-eat cereal  2 taco shells (5-inch size) Fruit 1 small fresh fruit ( to 1 cup)   medium banana  17 small grapes (3 ounces)  1 cup melon or berries   cup canned or frozen fruit  2 tablespoons dried fruit (blueberries, cherries, cranberries, raisins)   cup unsweetened fruit juice  Starchy Vegetables  cup cooked beans, peas, corn, potatoes/sweet potatoes   large baked potato (3 ounces)  1 cup acorn or butternut squash  Snack Foods 3 to 6 crackers  8 potato chips or 13 tortilla chips ( ounce to 1 ounce)  3 cups popped popcorn  Dairy 3/4 cup (6 ounces) nonfat plain yogurt, or yogurt with sugar-free sweetener  1 cup milk  1 cup plain rice, soy, coconut or flavored almond milk Sweets and Desserts  cup ice cream or frozen yogurt  1 tablespoon jam, jelly, pancake syrup, table sugar, or honey  2 tablespoons light pancake syrup  1 inch square of frosted cake or 2 inch square of  unfrosted cake  2 small cookies (2/3 ounce each) or  large cookie  Sometimes you'll have to estimate carbohydrate amounts if you don't know the exact recipe. One cup of mixed foods like soups can have 1 to 2 carbohydrate servings, while some casseroles might have 2 or more servings of carbohydrate. Foods that have less than 20 calories in each serving can be counted as  "free" foods. Count 1 cup raw vegetables, or  cup cooked non-starchy vegetables as "free" foods. If you eat 3 or more servings at one meal, then count them as 1 carbohydrate serving.  Foods without Carbohydrates  Not all foods contain carbohydrates. Meat, some dairy, fats, non-starchy vegetables, and many beverages don't contain carbohydrate. So when you count carbohydrates, you can generally exclude chicken, pork, beef, fish, seafood, eggs, tofu, cheese, butter, sour cream, avocado, nuts, seeds, olives, mayonnaise, water, black coffee, unsweetened tea, and zero-calorie drinks. Vegetables with no or low carbohydrate include green beans, cauliflower, tomatoes, and onions. How much carbohydrate should I eat at each meal?  Carbohydrate counting can help you plan your meals and manage your weight. Following are some starting points for carbohydrate intake at each meal. Work with your registered dietitian nutritionist to find the best range that works for your blood glucose and weight.   To Lose Weight To Maintain Weight  Women 2 - 3 carb servings 3 - 4 carb servings  Men 3 - 4 carb servings 4 - 5 carb servings  Checking your blood glucose after meals will help you know if you need to adjust the timing, type, or number of carbohydrate servings in your meal plan. Achieve and keep a healthy body weight by balancing your food intake and physical activity.  Tips How should I plan my meals?  Plan for half the food on your plate to include non-starchy vegetables, like salad greens, broccoli, or carrots. Try to eat 3 to 5 servings of non-starchy vegetables every day. Have a protein food at each meal. Protein foods include chicken, fish, meat, eggs, or beans (note that beans contain carbohydrate). These two food groups (non-starchy vegetables and proteins) are low in carbohydrate. If you fill up your plate with these foods, you will eat less carbohydrate but still fill up your stomach. Try to limit your carbohydrate  portion to  of the plate.  What fats are healthiest to eat?  Diabetes increases risk for heart disease. To help protect your heart, eat more healthy fats, such as olive oil, nuts, and avocado. Eat less saturated fats like butter, cream, and high-fat meats, like bacon and sausage. Avoid trans fats, which are in all foods that list "partially hydrogenated oil" as an ingredient. What should I drink?  Choose drinks that are not sweetened with sugar. The healthiest choices are water, carbonated or seltzer waters, and tea and coffee without added sugars.  Sweet drinks will make your blood glucose go up very quickly. One serving of soda or energy drink is  cup. It is best to drink these beverages only if your blood glucose is low.  Artificially sweetened, or diet drinks, typically do not increase your blood glucose if they have zero calories in them. Read labels of beverages, as some diet drinks do have carbohydrate and will raise your blood glucose. Label Reading Tips Read Nutrition Facts labels to find out how many grams of carbohydrate are in a food you want to eat. Don't forget: sometimes serving sizes on the label aren't the same as how much food  you are going to eat, so you may need to calculate how much carbohydrate is in the food you are serving yourself.   Carbohydrate Counting for People with Diabetes Sample 1-Day Menu  Breakfast  cup yogurt, low fat, low sugar (1 carbohydrate serving)   cup cereal, ready-to-eat, unsweetened (1 carbohydrate serving)  1 cup strawberries (1 carbohydrate serving)   cup almonds ( carbohydrate serving)  Lunch 1, 5 ounce can chunk light tuna  2 ounces cheese, low fat cheddar  6 whole wheat crackers (1 carbohydrate serving)  1 small apple (1 carbohydrate servings)   cup carrots ( carbohydrate serving)   cup snap peas  1 cup 1% milk (1 carbohydrate serving)   Evening Meal Stir fry made with: 3 ounces chicken  1 cup brown rice (3 carbohydrate servings)    cup broccoli ( carbohydrate serving)   cup green beans   cup onions  1 tablespoon olive oil  2 tablespoons teriyaki sauce ( carbohydrate serving)  Evening Snack 1 extra small banana (1 carbohydrate serving)  1 tablespoon peanut butter   Carbohydrate Counting for People with Diabetes Vegan Sample 1-Day Menu  Breakfast 1 cup cooked oatmeal (2 carbohydrate servings)   cup blueberries (1 carbohydrate serving)  2 tablespoons flaxseeds  1 cup soymilk fortified with calcium and vitamin D  1 cup coffee  Lunch 2 slices whole wheat bread (2 carbohydrate servings)   cup baked tofu   cup lettuce  2 slices tomato  2 slices avocado   cup baby carrots ( carbohydrate serving)  1 orange (1 carbohydrate serving)  1 cup soymilk fortified with calcium and vitamin D   Evening Meal Burrito made with: 1 6-inch corn tortilla (1 carbohydrate serving)  1 cup refried vegetarian beans (2 carbohydrate servings)   cup chopped tomatoes   cup lettuce   cup salsa  1/3 cup brown rice (1 carbohydrate serving)  1 tablespoon olive oil for rice   cup zucchini   Evening Snack 6 small whole grain crackers (1 carbohydrate serving)  2 apricots ( carbohydrate serving)   cup unsalted peanuts ( carbohydrate serving)    Carbohydrate Counting for People with Diabetes Vegetarian (Lacto-Ovo) Sample 1-Day Menu  Breakfast 1 cup cooked oatmeal (2 carbohydrate servings)   cup blueberries (1 carbohydrate serving)  2 tablespoons flaxseeds  1 egg  1 cup 1% milk (1 carbohydrate serving)  1 cup coffee  Lunch 2 slices whole wheat bread (2 carbohydrate servings)  2 ounces low-fat cheese   cup lettuce  2 slices tomato  2 slices avocado   cup baby carrots ( carbohydrate serving)  1 orange (1 carbohydrate serving)  1 cup unsweetened tea  Evening Meal Burrito made with: 1 6-inch corn tortilla (1 carbohydrate serving)   cup refried vegetarian beans (1 carbohydrate serving)   cup tomatoes   cup lettuce    cup salsa  1/3 cup brown rice (1 carbohydrate serving)  1 tablespoon olive oil for rice   cup zucchini  1 cup 1% milk (1 carbohydrate serving)  Evening Snack 6 small whole grain crackers (1 carbohydrate serving)  2 apricots ( carbohydrate serving)   cup unsalted peanuts ( carbohydrate serving)    Copyright 2020  Academy of Nutrition and Dietetics. All rights reserved.  Using Nutrition Labels: Carbohydrate  Serving Size  Look at the serving size. All the information on the label is based on this portion. Servings Per Container  The number of servings contained in the package. Guidelines for Carbohydrate  Look at  the total grams of carbohydrate in the serving size.  1 carbohydrate choice = 15 grams of carbohydrate. Range of Carbohydrate Grams Per Choice  Carbohydrate Grams/Choice Carbohydrate Choices  6-10   11-20 1  21-25 1  26-35 2  36-40 2  41-50 3  51-55 3  56-65 4  66-70 4  71-80 5    Copyright 2020  Academy of Nutrition and Dietetics. All rights reserved.   Aaron Aas.Food Resources  Agency Name: [facility] Address: [address], [place], [place] [address] Phone: [phone] Website: [email or web address] Service(s) Offered: Housing services, self-sufficiency, congregate meal program, weatherization program, Event organiser program, emergency food assistance,  housing counseling, home ownership program, wheels - to work program.  [employer] free for 60 and older at various locations from [employer], Monday-Friday:  [employer], [address] [place], [phone] -[facility], [address], [place] [phone]  -[facility], [address], [place] [phone]  -[address], [address] [place], [phone]  Agency Name: [facility] Address: [phone], [place], [place] [address] Phone: [phone] Website:  [email or web address] Service(s) Offered: Home delivered hot, frozen, and emergency  meals. Grocery assistance program which matches  volunteers one-on-one with seniors unable to grocery shop  for themselves. Must be 60 years and older; less than 20  hours of in-home aide service, limited or no driving ability;  live alone or with someone with a disability; live in  [place].  Agency Name: [facility]) Address: [address], [place], [place] [address] Phone: [phone] Service(s) Offered: Food is served to shut-ins, homeless, elderly, and low income people in the community every Saturday (11:30 am-12:30 pm) and Sunday (12:30 pm-1:30pm). Volunteers also offer help and encouragement in seeking employment,  and spiritual guidance.  Agency Name: [facility] Address: [address] [place], [place] [address] Phone: [phone] Service(s) Offered: Child support services; child welfare services; food stamps; Medicaid; work first family assistance; and aid with fuel,  rent, food and medicine.  Agency Name: [facility]itian Address: [address] [place], [place] Phone: [phone] Website: [email or web address] Services Offered: Monday 10:00am-12:00, 8:00pm-9:00pm, and Friday 10:00am-12:00.  Agency Name: [employer] [facility] Address: [address] [place], [place] [address] Phone: [phone] Website: [email or web address] Service(s) Offered: Serves weekday meals, open from 11:30 am- 1:00 pm., and 6:30-7:30pm, Monday-Wednesday-Friday distributes food 3:30-6pm, Monday-Wednesday-Friday.  Agency Name: [facility] Address: [address] [place], [place] Phone: [phone] Website: [email or web address] Services Offered: Distributes food the 4th Saturday of the month, starting at 8:00 am  Agency Name: [facility] Address: [phone] [address],  [place], [place] [address] Phone: [phone] Website: [email or web address] Service(s) Offered: Bread of life, weekly food pantry. Open Wednesdays from 10:00am-noon.  Agency Name: [facility] [employer] Address: [address] [place], [place], [place] Phone: [phone] Services Offered: Distributes food 9am-1pm, Monday-Thursday. Call for details.  Agency Name: [facility] Address: [address] [place], [place] [address] Phone: [phone] Website: [email or web address] Service(s) Offered: Games developer. Call for assistance.  Agency Name: [facility] Address: [address], [place], [place] [address] Phone: [phone] Service Offered: Emergency Food Pantry. Call for appointment.  Agency Name: [facility] Address: [address] [place], [place] [address] Phone: [phone] Website: [email or web address] Services Offered: Games developer. Call for details  Agency Name: [facility] Address: [address] [place], [place] Phone: [phone]  Website: [email or web address] Service(s) Offered: Games developer. Call for details.  Agency Name: Holiday representative Address: [address] [place], [place] [address] Phone: [phone] or [phone] Website: [email or web address] Service(s) Offered: Distribute food 9am-11:30 am, Tuesday-Friday, and 1-3:30pm, Monday-Friday. Food pantry Monday-Friday 1pm-3pm, fresh items, Mon.-Wed.-Fri.  Agency Name: [facility] Address: [address] [place], [place] [address] Phone: [phone] Website: [email or web address] Services Offered: Distribute food Tues and Sats from 9:00am-noon. Closed 1st Saturday of each month. Call for details  Agency Name: [employer] Address: [address] [facility] [address] [place] [address] Phone: [phone]  Services Offered: Delivers meals every Thursday    .Aaron AasTransportation Resources  Agency Name: [facility] Agency Address: [address] [place], [place] [address] Phone: [phone] Email: [email or web address] Website: [email or web address] Service(s) Offered: Housing services, self-sufficiency, congregate meal program, weatherization program, Field seismologist program, emergency food assistance,  housing counseling, home ownership program, wheels-towork program.  Agency Name: [employer] [phone]) Address: [address] [place], [place] [address] Phone: [phone] Website: [email or web address] Service(s) Offered: Transportation for [employer], subscription and demand response; Dial-a-Ride for citizens 55 years of age or older.  Agency Name: Department of Social Services Address: [address] [place], [place] [address] Phone: [phone] Service(s) Offered: Child support services; child welfare services; food stamps; Medicaid; work first family assistance; and aid with fuel,  rent, food and medicine, transportation assistance.  Agency Name: Disabled [employer] (DA[employer]) Transportation  Network Phone: [phone] Service(s) Offered: Transports veterans to the [facility]. Call  forty-eight hours in advance and leave the name, telephone  number, date, and time of appointment. Veteran will be  contacted by the driver the day before the appointment to  arrange a pick up point  .Aaron AasRent/Utility/Housing  Agency Name: [facility] Agency Address: [address] [place], [place] [address] Phone: [phone] Email: [email or web address] Website: [email or web address] Service(s) Offered: Housing services, self-sufficiency, congregate meal program, weatherization program, Field seismologist program, emergency food assistance,  housing counseling, home ownership program, wheels -towork  program.  Agency Name: [facility] Address: [address] [place], [place] [address] Phone: [phone] (8a-4p) [phone] (8p- 10p) Email: [email or web address] Website: [email or web address] Service(s) Offered: A program for homeless and/or needy men that includes one-on-one counseling, life skills training and job rehabilitation.  Agency Name: [facility] Address: [address] [place], [place] [address] Phone: [phone] Website: [email or web address] Service(s) Offered: Assistance to needy in emergency with utility bills, heating fuel, and prescriptions. Shelter for homeless 7pm-7am. August 29, 2016 15  Agency Name: [facility] [place] [facility]Developmentally Disabled) Address: [address], [place], [place] [address] Phone: [phone] Contact Person: Genie Key Email: [email or web address] Website: [email or web address] Service(s) Offered: Helps individuals with developmental disabilities move from housing that is more restrictive to homes where they  can achieve greater independence and have more  opportunities.  Agency Name: [facility] Address: [address], [place], [place] [address] Phone: [phone] Email: [email or web address] Website: [email or web address].org Service(s) Offered: Provides affordable housing for low-income families, elderly, and disabled individuals. Offer a wide range of  programs and services, from financial planning to afterschool and summer programs.  Agency Name: Department of Social Services Address: [address] [place], [place] [address] Phone: [phone] Service(s) Offered: Child support services; child welfare services; food stamps; Medicaid; work first family assistance; and aid with fuel,  rent, food and medicine.  Agency Name: [facility] [place] [employer]. Address: [facility] [phone]  [address] [place], [place]   [address] Phone: [phone] Website: www.familyabuseservices.org Service(s) Offered: 24 hour Crisis Line: [phone]; 24 hour Emergency Shelter; Transitional Housing; Support Groups; Scientist, physiological; [employer]; Hispanic Outreach: [phone];  Visitation Center: [phone].  Agency Name: [facility], [place]. Address: [address] [place], [place] [address] Phone: [phone] Service(s) Offered: CAP Services; Home and [employer]; Individual or Group Supports; Respite Care Non-Institutional Nursing;  Residential Supports; Respite Care and Personal Care Services; Transportation; Family and Friends Night; Recreational Activities; Three Nutritious Meals/Snacks; Consultation with Registered Dietician; Twenty-four hour Registered Nurse Access; Daily and [employer]; Camp [employer]aves; [place] for the [employer] (During Summer Months) Bingo Night (Every  Wednesday Night); Special Populations Dance Night  (Every Tuesday Night); Professional Hair Care Services.  Agency Name: [facility] Address: [address], [place], [place] [address] Phone: [phone] Contact Person: Richardo Chandler Website: [email or web address].Physicist, medical) Offered: Residential treatment facility for women; food and  clothing, educational & employment development and  transportation to work; Counsellor of financial skills;  parenting and family reunification; emotional and spiritual  support; transitional housing for program graduates.  Agency Name: [facility] Address: [address], [place], [place] [address] Phone: [phone] Email: [email or web address] Website: [email or web address].es Service(s) Offered: Public housing units for elderly, disabled, and low income people; housing choice vouchers for income eligible  applicants; shelter plus care vouchers; and Psychologist, clinical.  Agency Name: [facility]  [employer] Address: [address], [place], [place] [address] Phone: [phone] Email: [email or web address] Website: www.habitatalamance.org Service(s) Offered: Build houses for families in need of decent housing. Each adult in the family must invest 200 hours of labor on  someone else's house, work with volunteers to build their own house, attend classes on budgeting, home maintenance, yard care, and attend homeowner association meetings.  Agency Name: [facility] Address: [address] [place], [place] [address] Phone: [phone] Website: www.rsli.org Service(s) Offered: Intermediate care facilities for intellectually delayed, Supervised Living in group homes for adults with developmental disabilities, Supervised Living for people who have dual diagnoses (MRMI), Independent Living, Supported Living, respite and a variety of CAP services, pre-vocational services, day supports, and [employer].  Agency Name: [place] [employer] [facility] Phone: [phone] Website: www.NCForeclosurePrevention.gov Service(s) Offered: Zero-interest, deferred loans to homeowners struggling to pay their mortgage. Call for more information.

## 2023-08-22 NOTE — Progress Notes (Signed)
 PROGRESS NOTE    Barry Raymond   ZOX:[id number] DOB: 12/29/1965  DOA: 08/21/2023 Date of Service: 08/22/23 which is hospital day 0  PCP: Bluford Burkitt, NP    Hospital course / significant events:   HPI: Barry Raymond is a 58 y.o. male with medical history significant of tobacco abuse, cocaine abuse, morbid obesity, COPD, GERD, BPH, chronic back pain, who presents with polyuria, polydipsia, generalized weakness, nausea, vomiting, chest pain.   Of note, last A1C 01/2023 was 6.4/prediabetic.   04/17: to ED. (+)DKA. Admitted to hospitalist service.  04/18: remains on insulin  drip, BHB still high into this afternoon, continue per DKA protocol on insulin  drip until BHB normalized but AG is improving and Glc has been stable      Consultants:  none  Procedures/Surgeries: none      ASSESSMENT & PLAN:   Diabetic Ketoacidosis S/p insulin  and isotonic IV fluid resuscitation w/ LR 2.8 L A1C  Insulin  drip per DKA protocol  POC Glc q1h to titrate insulin  q4h BMP and Beta Hydroxybutyrate  D5LR 125 mL/h started when Glc <250 When normalize anion gap and low stable insulin  requirement for 4 hours, will calculate 24hr insulin  requirement, start long acting insulin  semglee , sliding scale novolog , stop fluids, start diet stop drip 1-2 hours AFTER long-acting insulin  administered   Chest pain:  resolved. Troponin 2 --> 4 --> 4 Have ruled out ACS ASA 81 mg daily As needed nitroglycerin , Percocet for pain   COPD (chronic obstructive pulmonary disease) Stable as needed albuterol  and Mucinex    Chronic back pain As needed Percocet, Tylenol  for pain As needed Flexeril  for muscle spasm   Tobacco abuse and Cocaine abuse Nicotine  patch check UDS Counseling about importance of quitting substance use    Class 3 obesity based on BMI: Body mass index is 44.08 kg/m.  Underweight - under 18  overweight - 25 to 29 obese - 30 or more Class 1 obesity: BMI of 30.0 to 34 Class 2 obesity: BMI  of 35.0 to 39 Class 3 obesity: BMI of 40.0 to 49 Super Morbid Obesity: BMI 50-59 Super-super Morbid Obesity: BMI 60+ Significantly low or high BMI is associated with higher medical risk.  Weight management advised as adjunct to other disease management and risk reduction treatments    DVT prophylaxis: lovenox  IV fluids: D5LR continuous IV fluids as above Nutrition: NPO pending off insulin  drip Central lines / other devices: none  Code Status: FULL CODE ACP documentation reviewed: none on file in [facility]  TOC needs: TBD but expect home, may need med assistance Medical barriers to dispo: DKA. Expected medical readiness for discharge next 1-2 days.              Subjective / Brief ROS:  Patient reports nausea is persisting Denies CP/SOB.  Pain controlled.  Denies new weakness. .  Reports no concerns w/ urination/defecation.   Family Communication: wife at bedside on rounds     Objective Findings:  Vitals:   08/22/23 0700 08/22/23 1100 08/22/23 1200 08/22/23 1300  BP: (!) 142/85 (!) 142/79 124/83 119/74  Pulse: 95 94 95 95  Resp:  (!) 22  16  Temp:      TempSrc:      SpO2: 96% 97% 92% 93%  Weight:      Height:        Intake/Output Summary (Last 24 hours) at 08/22/2023 1453 Last data filed at 08/22/2023 1327 Gross per 24 hour  Intake 2820.93 ml  Output 1100 ml  Net 1720.93  ml   Filed Weights   08/21/23 2001 08/22/23 0049  Weight: (!) 140.6 kg 131.5 kg    Examination:  Physical Exam Constitutional:      General: He is not in acute distress.    Appearance: He is obese.  Cardiovascular:     Rate and Rhythm: Normal rate and regular rhythm.  Pulmonary:     Effort: Pulmonary effort is normal. No tachypnea or accessory muscle usage.  Musculoskeletal:     Right lower leg: No edema.     Left lower leg: No edema.  Neurological:     General: No focal deficit present.     Mental Status: He is alert and oriented to person, place, and time.  Psychiatric:         Mood and Affect: Mood normal.        Behavior: Behavior normal.          Scheduled Medications:   aspirin  EC  81 mg Oral Daily   enoxaparin  (LOVENOX ) injection  70 mg Subcutaneous Q24H   nicotine   21 mg Transdermal Daily   pantoprazole   40 mg Oral Q1200    Continuous Infusions:  dextrose  5% lactated ringers  125 mL/hr at 08/22/23 1327   insulin  5.5 Units/hr (08/22/23 1327)   lactated ringers  Stopped (08/22/23 0509)   promethazine  (PHENERGAN ) injection (IM or IVPB) Stopped (08/22/23 1108)    PRN Medications:  acetaminophen , albuterol , alum & mag hydroxide-simeth, cyclobenzaprine , dextromethorphan-guaiFENesin , dextrose , hydrALAZINE , nitroGLYCERIN , ondansetron  (ZOFRAN ) IV, oxyCODONE -acetaminophen , promethazine  (PHENERGAN ) injection (IM or IVPB)  Antimicrobials from admission:  Anti-infectives (From admission, onward)    None           Data Reviewed:  I have personally reviewed the following...  CBC: Recent Labs  Lab 08/21/23 2003  WBC 10.2  HGB 17.9*  HCT 52.2*  MCV 87.0  PLT 228   Basic Metabolic Panel: Recent Labs  Lab 08/21/23 2003 08/22/23 0111 08/22/23 0625 08/22/23 0833  NA 133* 134* 135 137  K 3.9 4.1 3.7 4.0  CL 97* 99 103 104  CO2 14* 14* 17* 20*  GLUCOSE 394* 371* 179* 190*  BUN 19 20 18 17   CREATININE 1.12 1.21 1.14 1.04  CALCIUM 9.3 9.5 9.6 9.6   GFR: Estimated Creatinine Clearance: 106.3 mL/min (by C-G formula based on SCr of 1.04 mg/dL). Liver Function Tests: No results for input(s): "AST", "ALT", "ALKPHOS", "BILITOT", "PROT", "ALBUMIN" in the last 168 hours. No results for input(s): "LIPASE", "AMYLASE" in the last 168 hours. No results for input(s): "AMMONIA" in the last 168 hours. Coagulation Profile: No results for input(s): "INR", "PROTIME" in the last 168 hours. Cardiac Enzymes: No results for input(s): "CKTOTAL", "CKMB", "CKMBINDEX", "TROPONINI" in the last 168 hours. BNP (last 3 results) No results for input(s):  "PROBNP" in the last 8760 hours. HbA1C: Recent Labs    08/21/23 2003  HGBA1C 12.4*   CBG: Recent Labs  Lab 08/22/23 1023 08/22/23 1121 08/22/23 1217 08/22/23 1325 08/22/23 1426  GLUCAP 187* 156* 176* 169* 156*   Lipid Profile: Recent Labs    08/22/23 0111  CHOL 230*  HDL 34*  LDLCALC 151*  TRIG 223*  CHOLHDL 6.8   Thyroid Function Tests: No results for input(s): "TSH", "T4TOTAL", "FREET4", "T3FREE", "THYROIDAB" in the last 72 hours. Anemia Panel: No results for input(s): "VITAMINB12", "FOLATE", "FERRITIN", "TIBC", "IRON", "RETICCTPCT" in the last 72 hours. Most Recent Urinalysis On File:     Component Value Date/Time   COLORURINE STRAW (A) 08/22/2023 0014   APPEARANCEUR CLEAR (  A) 08/22/2023 0014   LABSPEC 1.028 08/22/2023 0014   PHURINE 5.0 08/22/2023 0014   GLUCOSEU >=500 (A) 08/22/2023 0014   GLUCOSEU NEGATIVE 01/15/2023 1320   HGBUR SMALL (A) 08/22/2023 0014   BILIRUBINUR NEGATIVE 08/22/2023 0014   KETONESUR 80 (A) 08/22/2023 0014   PROTEINUR 30 (A) 08/22/2023 0014   UROBILINOGEN 4.0 (A) 01/15/2023 1320   NITRITE NEGATIVE 08/22/2023 0014   LEUKOCYTESUR NEGATIVE 08/22/2023 0014   Sepsis Labs: @LABRCNTIP (procalcitonin:4,lacticidven:4) Microbiology: Recent Results (from the past 240 hours)  MRSA Next Gen by PCR, Nasal     Status: None   Collection Time: 08/22/23  1:14 AM   Specimen: Nasal Mucosa; Nasal Swab  Result Value Ref Range Status   MRSA by PCR Next Gen NOT DETECTED NOT DETECTED Final    Comment: (NOTE) The GeneXpert MRSA Assay (FDA approved for NASAL specimens only), is one component of a comprehensive MRSA colonization surveillance program. It is not intended to diagnose MRSA infection nor to guide or monitor treatment for MRSA infections. Test performance is not FDA approved in patients less than 33 years old. Performed at [facility], [address] [place], [place] [address]       Radiology Studies last 3 days: DG Chest 2  View Result Date: 08/21/2023 CLINICAL DATA:  Chest pain EXAM: CHEST - 2 VIEW COMPARISON:  None Available. FINDINGS: The heart size and mediastinal contours are within normal limits. Both lungs are clear. The visualized skeletal structures are unremarkable. IMPRESSION: No active cardiopulmonary disease. Electronically Signed   By: Worthy Heads M.D.   On: 08/21/2023 21:22       Time spent: 50 min    Karmon Andis, DO [facility] 08/22/2023, 2:53 PM    Dictation software may have been used to generate the above note. Typos may occur and escape review in typed/dictated notes. Please contact Dr Authur Leghorn directly for clarity if needed.  Staff may message me via secure chat in Epic  but this may not receive an immediate response,  please page me for urgent matters!  If 7PM-7AM, please contact night coverage [email or web address]
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
## 2023-08-22 NOTE — Plan of Care (Signed)
 Problem: Education: Goal: Ability to describe self-care measures that may prevent or decrease complications (Diabetes Survival Skills Education) will improve 08/22/2023 1724 by Cyndia Drape, RN Outcome: Progressing 08/22/2023 1724 by Cyndia Drape, RN Outcome: Progressing Goal: Individualized Educational Video(s) 08/22/2023 1724 by Cyndia Drape, RN Outcome: Progressing 08/22/2023 1724 by Cyndia Drape, RN Outcome: Progressing   Problem: Coping: Goal: Ability to adjust to condition or change in health will improve 08/22/2023 1724 by Cyndia Drape, RN Outcome: Progressing 08/22/2023 1724 by Cyndia Drape, RN Outcome: Progressing   Problem: Fluid Volume: Goal: Ability to maintain a balanced intake and output will improve 08/22/2023 1724 by Cyndia Drape, RN Outcome: Progressing 08/22/2023 1724 by Cyndia Drape, RN Outcome: Progressing   Problem: Health Behavior/Discharge Planning: Goal: Ability to identify and utilize available resources and services will improve 08/22/2023 1724 by Cyndia Drape, RN Outcome: Progressing 08/22/2023 1724 by Cyndia Drape, RN Outcome: Progressing Goal: Ability to manage health-related needs will improve 08/22/2023 1724 by Cyndia Drape, RN Outcome: Progressing 08/22/2023 1724 by Cyndia Drape, RN Outcome: Progressing   Problem: Metabolic: Goal: Ability to maintain appropriate glucose levels will improve 08/22/2023 1724 by Cyndia Drape, RN Outcome: Progressing 08/22/2023 1724 by Cyndia Drape, RN Outcome: Progressing   Problem: Nutritional: Goal: Maintenance of adequate nutrition will improve 08/22/2023 1724 by Cyndia Drape, RN Outcome: Progressing 08/22/2023 1724 by Cyndia Drape, RN Outcome: Progressing Goal: Progress toward achieving an optimal weight will improve 08/22/2023 1724 by Cyndia Drape, RN Outcome: Progressing 08/22/2023 1724 by Cyndia Drape, RN Outcome: Progressing   Problem: Skin Integrity: Goal: Risk for  impaired skin integrity will decrease 08/22/2023 1724 by Cyndia Drape, RN Outcome: Progressing 08/22/2023 1724 by Cyndia Drape, RN Outcome: Progressing   Problem: Tissue Perfusion: Goal: Adequacy of tissue perfusion will improve 08/22/2023 1724 by Cyndia Drape, RN Outcome: Progressing 08/22/2023 1724 by Cyndia Drape, RN Outcome: Progressing   Problem: Education: Goal: Ability to describe self-care measures that may prevent or decrease complications (Diabetes Survival Skills Education) will improve 08/22/2023 1724 by Cyndia Drape, RN Outcome: Progressing 08/22/2023 1724 by Cyndia Drape, RN Outcome: Progressing Goal: Individualized Educational Video(s) 08/22/2023 1724 by Cyndia Drape, RN Outcome: Progressing 08/22/2023 1724 by Cyndia Drape, RN Outcome: Progressing   Problem: Cardiac: Goal: Ability to maintain an adequate cardiac output will improve 08/22/2023 1724 by Cyndia Drape, RN Outcome: Progressing 08/22/2023 1724 by Cyndia Drape, RN Outcome: Progressing   Problem: Health Behavior/Discharge Planning: Goal: Ability to identify and utilize available resources and services will improve 08/22/2023 1724 by Cyndia Drape, RN Outcome: Progressing 08/22/2023 1724 by Cyndia Drape, RN Outcome: Progressing Goal: Ability to manage health-related needs will improve 08/22/2023 1724 by Cyndia Drape, RN Outcome: Progressing 08/22/2023 1724 by Cyndia Drape, RN Outcome: Progressing   Problem: Fluid Volume: Goal: Ability to achieve a balanced intake and output will improve 08/22/2023 1724 by Cyndia Drape, RN Outcome: Progressing 08/22/2023 1724 by Cyndia Drape, RN Outcome: Progressing   Problem: Metabolic: Goal: Ability to maintain appropriate glucose levels will improve 08/22/2023 1724 by Cyndia Drape, RN Outcome: Progressing 08/22/2023 1724 by Cyndia Drape, RN Outcome: Progressing   Problem: Nutritional: Goal: Maintenance of adequate nutrition will  improve 08/22/2023 1724 by Cyndia Drape, RN Outcome: Progressing 08/22/2023 1724 by Cyndia Drape, RN Outcome: Progressing Goal: Maintenance of adequate weight for body size and type will improve 08/22/2023 1724 by Cyndia Drape, RN Outcome: Progressing 08/22/2023 1724 by Cyndia Drape, RN Outcome: Progressing   Problem: Respiratory: Goal: Will regain and/or maintain adequate ventilation 08/22/2023 1724 by Cyndia Drape, RN Outcome: Progressing 08/22/2023  1724 by Cyndia Drape, RN Outcome: Progressing   Problem: Urinary Elimination: Goal: Ability to achieve and maintain adequate renal perfusion and functioning will improve 08/22/2023 1724 by Cyndia Drape, RN Outcome: Progressing 08/22/2023 1724 by Cyndia Drape, RN Outcome: Progressing   Problem: Education: Goal: Knowledge of General Education information will improve Description: Including pain rating scale, medication(s)/side effects and non-pharmacologic comfort measures 08/22/2023 1724 by Cyndia Drape, RN Outcome: Progressing 08/22/2023 1724 by Cyndia Drape, RN Outcome: Progressing   Problem: Health Behavior/Discharge Planning: Goal: Ability to manage health-related needs will improve 08/22/2023 1724 by Cyndia Drape, RN Outcome: Progressing 08/22/2023 1724 by Cyndia Drape, RN Outcome: Progressing   Problem: Clinical Measurements: Goal: Ability to maintain clinical measurements within normal limits will improve 08/22/2023 1724 by Cyndia Drape, RN Outcome: Progressing 08/22/2023 1724 by Cyndia Drape, RN Outcome: Progressing Goal: Will remain free from infection Outcome: Progressing Goal: Diagnostic test results will improve Outcome: Progressing Goal: Respiratory complications will improve Outcome: Progressing Goal: Cardiovascular complication will be avoided Outcome: Progressing   Problem: Activity: Goal: Risk for activity intolerance will decrease Outcome: Progressing   Problem: Nutrition: Goal:  Adequate nutrition will be maintained Outcome: Progressing   Problem: Coping: Goal: Level of anxiety will decrease Outcome: Progressing   Problem: Elimination: Goal: Will not experience complications related to bowel motility Outcome: Progressing Goal: Will not experience complications related to urinary retention Outcome: Progressing   Problem: Pain Managment: Goal: General experience of comfort will improve and/or be controlled Outcome: Progressing   Problem: Safety: Goal: Ability to remain free from injury will improve Outcome: Progressing   Problem: Skin Integrity: Goal: Risk for impaired skin integrity will decrease Outcome: Progressing

## 2023-08-22 NOTE — Inpatient Diabetes Management (Addendum)
 Inpatient Diabetes Program Recommendations  AACE/ADA: New Consensus Statement on Inpatient Glycemic Control (2015)  Target Ranges:  Prepandial:   less than 140 mg/dL      Peak postprandial:   less than 180 mg/dL (1-2 hours)      Critically ill patients:  140 - 180 mg/dL    Latest Reference Range & Units 08/21/23 22:25  Beta-Hydroxybutyric Acid 0.05 - 0.27 mmol/L 7.44 (H)    Latest Reference Range & Units 01/15/23 13:20 08/21/23 20:03  Hemoglobin A1C 4.8 - 5.6 % 6.4 12.4 (H)  309 mg/dl    Latest Reference Range & Units 08/21/23 20:03  Sodium 135 - 145 mmol/L 133 (L)  Potassium 3.5 - 5.1 mmol/L 3.9  Chloride 98 - 111 mmol/L 97 (L)  CO2 22 - 32 mmol/L 14 (L)  Glucose 70 - 99 mg/dL 605 (H)  Mean Plasma Glucose mg/dL 690.81  BUN 6 - 20 mg/dL 19  Creatinine 9.38 - 8.75 mg/dL 8.87  Calcium 8.9 - 89.6 mg/dL 9.3  Anion gap 5 - 15  22 (H)    Latest Reference Range & Units 08/22/23 00:12 08/22/23 00:40 08/22/23 01:52 08/22/23 02:44 08/22/23 03:48 08/22/23 05:02 08/22/23 06:15  Glucose-Capillary 70 - 99 mg/dL 603 (H)  IV Insulin  Drip Started @0021  451 (H) 313 (H) 306 (H) 239 (H) 235 (H) 177 (H)  IV Insulin  Drip Running  (H): Data is abnormally high   Admit with: Polyuria, polydipsia, generalized weakness, nausea, vomiting, chest pain  DKA/ New Diagnosis Diabetes  History: COPD, Cocaine abuse   Current Orders: IV Insulin  Drip   MD- Please consider checking another BHB level this AM  Please keep on the IV Insulin  Drip until BHB WNL and CO2 20 or higher and Anion Gap 12 or less  Have sent request to OP pharmacy to check on insulin  prices for pt    Note New diagnosis Diabetes Will order educational materials and speak to pt today Likely will need Insulin  for home given he was admitted with DKA and A1c= 12.4%     --Will follow patient during hospitalization--  Adina Rudolpho Arrow RN, MSN, CDCES Diabetes Coordinator Inpatient Glycemic Control Team Team Pager:  [phone] (8a-5p)

## 2023-08-22 NOTE — Telephone Encounter (Signed)
 Patient Product/process Development Scientist completed.    The patient is insured through [facility] [place].     Ran test claim for Lantus  Pen and the current 30 day co-pay is $4.00.  Ran test claim for Novolog  Pen and the current 30 day co-pay is $4.00.  Ran test claim for Dexcom G7 Sensor and Requires Prior Authorization  Ran test claim for [employer] 3 Plus Sensor and Requires Prior Authorization  This test claim was processed through [employer]- copay amounts may vary at other pharmacies due to [employer], or as the patient moves through the different stages of their insurance plan.     Reyes Sharps, CPHT Pharmacy Technician III Certified Patient Advocate [facility] Patient Advocate Team Direct Number: [phone]  Fax: [phone]

## 2023-08-23 DIAGNOSIS — E1110 Type 2 diabetes mellitus with ketoacidosis without coma: Secondary | ICD-10-CM | POA: Diagnosis not present

## 2023-08-23 LAB — GLUCOSE, CAPILLARY
Glucose-Capillary: 151 mg/dL — ABNORMAL HIGH (ref 70–99)
Glucose-Capillary: 165 mg/dL — ABNORMAL HIGH (ref 70–99)
Glucose-Capillary: 166 mg/dL — ABNORMAL HIGH (ref 70–99)
Glucose-Capillary: 168 mg/dL — ABNORMAL HIGH (ref 70–99)
Glucose-Capillary: 172 mg/dL — ABNORMAL HIGH (ref 70–99)
Glucose-Capillary: 179 mg/dL — ABNORMAL HIGH (ref 70–99)
Glucose-Capillary: 180 mg/dL — ABNORMAL HIGH (ref 70–99)
Glucose-Capillary: 180 mg/dL — ABNORMAL HIGH (ref 70–99)
Glucose-Capillary: 181 mg/dL — ABNORMAL HIGH (ref 70–99)
Glucose-Capillary: 183 mg/dL — ABNORMAL HIGH (ref 70–99)
Glucose-Capillary: 196 mg/dL — ABNORMAL HIGH (ref 70–99)
Glucose-Capillary: 223 mg/dL — ABNORMAL HIGH (ref 70–99)
Glucose-Capillary: 232 mg/dL — ABNORMAL HIGH (ref 70–99)
Glucose-Capillary: 270 mg/dL — ABNORMAL HIGH (ref 70–99)

## 2023-08-23 LAB — BASIC METABOLIC PANEL WITH GFR
Anion gap: 10 (ref 5–15)
BUN: 19 mg/dL (ref 6–20)
CO2: 23 mmol/L (ref 22–32)
Calcium: 9.1 mg/dL (ref 8.9–10.3)
Chloride: 107 mmol/L (ref 98–111)
Creatinine, Ser: 1.09 mg/dL (ref 0.61–1.24)
GFR, Estimated: >60 mL/min (ref >60)
Glucose, Bld: 163 mg/dL — ABNORMAL HIGH (ref 70–99)
Potassium: 3.5 mmol/L (ref 3.5–5.1)
Sodium: 140 mmol/L (ref 135–145)

## 2023-08-23 LAB — BETA-HYDROXYBUTYRIC ACID: Beta-Hydroxybutyric Acid: 0.5 mmol/L — ABNORMAL HIGH (ref 0.05–0.27)

## 2023-08-23 MED ORDER — POTASSIUM CHLORIDE 10 MEQ/100ML IV SOLN
10.0000 meq | INTRAVENOUS | Status: AC
Start: 2023-08-23 — End: 2023-08-23
  Administered 2023-08-23 (×2): 10 meq via INTRAVENOUS
  Filled 2023-08-23 (×2): qty 100

## 2023-08-23 MED ORDER — INSULIN ASPART 100 UNIT/ML IJ SOLN
0.0000 [IU] | INTRAMUSCULAR | Status: DC
  Administered 2023-08-23: 2 [IU] via SUBCUTANEOUS
  Administered 2023-08-23: 3 [IU] via SUBCUTANEOUS
  Administered 2023-08-23 – 2023-08-24 (×2): 5 [IU] via SUBCUTANEOUS
  Administered 2023-08-24 (×3): 3 [IU] via SUBCUTANEOUS
  Administered 2023-08-24: 5 [IU] via SUBCUTANEOUS
  Administered 2023-08-24: 3 [IU] via SUBCUTANEOUS
  Administered 2023-08-25 (×2): 5 [IU] via SUBCUTANEOUS
  Administered 2023-08-25 (×5): 3 [IU] via SUBCUTANEOUS
  Filled 2023-08-23 (×14): qty 1

## 2023-08-23 MED ORDER — DEXTROSE IN LACTATED RINGERS 5 % IV SOLN: INTRAVENOUS | Status: DC

## 2023-08-23 MED ORDER — INSULIN GLARGINE-YFGN 100 UNIT/ML ~~LOC~~ SOLN
20.0000 [IU] | Freq: Every day | SUBCUTANEOUS | Status: DC
  Administered 2023-08-23 – 2023-08-24 (×2): 20 [IU] via SUBCUTANEOUS
  Filled 2023-08-23 (×2): qty 0.2

## 2023-08-23 NOTE — Progress Notes (Signed)
 1C RN to return my call for report. RN admitting another patient at this time.

## 2023-08-23 NOTE — Progress Notes (Signed)
 Report given to [name] taking over care of patient transferring to 118. All personal belongings packed and sent with patient. Patient transported on 5L O2. NT transferred patient to 118. Patient to call wife regarding moving out of ICU to 118.

## 2023-08-23 NOTE — Progress Notes (Signed)
 PROGRESS NOTE    Barry Raymond   MWU:[id number] DOB: February 13, 1966  DOA: 08/21/2023 Date of Service: 08/23/23 which is hospital day 1  PCP: Bluford Burkitt, NP    Hospital course / significant events:   HPI: Barry Raymond is a 58 y.o. male with medical history significant of tobacco abuse, cocaine abuse, morbid obesity, COPD, GERD, BPH, chronic back pain, who presents with polyuria, polydipsia, generalized weakness, nausea, vomiting, chest pain.   Of note, last A1C 01/2023 was 6.4/prediabetic.   04/17: to ED. (+)DKA. Admitted to hospitalist service.  04/18: remains on insulin  drip, BHB still high into this afternoon, continue per DKA protocol on insulin  drip until BHB normalized but AG is improving and Glc has been stable 04/19: off insulin  drip and onto sq, advancing diet as tolerated, continues to have significant nausea but reports it;s a bit better today   Consultants:  none  Procedures/Surgeries: none     ASSESSMENT & PLAN:   Diabetic Ketoacidosis S/p insulin  and isotonic IV fluid resuscitation w/ LR 2.8 L A1C  BMP and Beta Hydroxybutyrate repeat in AM start long acting insulin  semglee , sliding scale novolog  q4h until tolerating diet then can do achs  stop fluids once tolerating po diet stop drip 1-2 hours AFTER long-acting insulin  administered   Chest pain:  resolved. Troponin 2 --> 4 --> 4 Have ruled out ACS ASA 81 mg daily As needed nitroglycerin , Percocet for pain   COPD (chronic obstructive pulmonary disease) Stable as needed albuterol  and Mucinex    Chronic back pain As needed Percocet, Tylenol  for pain As needed Flexeril  for muscle spasm   Tobacco abuse and Cocaine abuse Nicotine  patch Counseling about importance of quitting substance use    Class 3 obesity based on BMI: Body mass index is 44.08 kg/m.  Underweight - under 18  overweight - 25 to 29 obese - 30 or more Class 1 obesity: BMI of 30.0 to 34 Class 2 obesity: BMI of 35.0 to 39 Class 3  obesity: BMI of 40.0 to 49 Super Morbid Obesity: BMI 50-59 Super-super Morbid Obesity: BMI 60+ Significantly low or high BMI is associated with higher medical risk.  Weight management advised as adjunct to other disease management and risk reduction treatments    DVT prophylaxis: lovenox  - pt is refusing this  IV fluids: D5LR continuous IV fluids as above can d/c once eating Nutrition: NPO pending off insulin  drip --> advance diet as toelrated  Central lines / other devices: none  Code Status: FULL CODE ACP documentation reviewed: none on file in [facility]  TOC needs: TBD but expect home, may need med assistance Medical barriers to dispo: DKA. Expected medical readiness for discharge next 1-2 days.              Subjective / Brief ROS:  Patient reports nausea is persisting but a bit better today Back pain is bothering him, this issue is chronic  Denies CP/SOB.  Denies new weakness. .  Reports no concerns w/ urination/defecation.   Family Communication: wife at bedside on rounds     Objective Findings:  Vitals:   08/23/23 0500 08/23/23 0600 08/23/23 1200 08/23/23 1202  BP:    (!) 146/94  Pulse: 88 76  81  Resp:    19  Temp:   98.2 F (36.8 C)   TempSrc:   Oral   SpO2: 91% 96%  96%  Weight:      Height:        Intake/Output Summary (Last 24 hours) at 08/23/2023  1455 Last data filed at 08/23/2023 1400 Gross per 24 hour  Intake 2111.63 ml  Output 1280 ml  Net 831.63 ml   Filed Weights   08/21/23 2001 08/22/23 0049  Weight: (!) 140.6 kg 131.5 kg    Examination:  Physical Exam Constitutional:      General: He is not in acute distress.    Appearance: He is obese.  Cardiovascular:     Rate and Rhythm: Normal rate and regular rhythm.  Pulmonary:     Effort: Pulmonary effort is normal. No tachypnea or accessory muscle usage.  Musculoskeletal:     Right lower leg: No edema.     Left lower leg: No edema.  Neurological:     General: No focal deficit  present.     Mental Status: He is alert and oriented to person, place, and time.  Psychiatric:        Mood and Affect: Mood normal.        Behavior: Behavior normal.          Scheduled Medications:   aspirin  EC  81 mg Oral Daily   enoxaparin  (LOVENOX ) injection  70 mg Subcutaneous Q24H   insulin  aspart  0-9 Units Subcutaneous Q4H   insulin  glargine-yfgn  20 Units Subcutaneous Daily   nicotine   21 mg Transdermal Daily   pantoprazole   40 mg Oral Q1200    Continuous Infusions:  dextrose  5% lactated ringers  Stopped (08/23/23 1152)   insulin  Stopped (08/23/23 1152)   promethazine  (PHENERGAN ) injection (IM or IVPB) Stopped (08/23/23 0529)    PRN Medications:  acetaminophen , albuterol , alum & mag hydroxide-simeth, cyclobenzaprine , dextromethorphan-guaiFENesin , dextrose , hydrALAZINE , nitroGLYCERIN , ondansetron  (ZOFRAN ) IV, oxyCODONE -acetaminophen , promethazine  (PHENERGAN ) injection (IM or IVPB)  Antimicrobials from admission:  Anti-infectives (From admission, onward)    None           Data Reviewed:  I have personally reviewed the following...  CBC: Recent Labs  Lab 08/21/23 2003  WBC 10.2  HGB 17.9*  HCT 52.2*  MCV 87.0  PLT 228   Basic Metabolic Panel: Recent Labs  Lab 08/22/23 0833 08/22/23 1251 08/22/23 1643 08/22/23 2108 08/23/23 0742  NA 137 138 140 141 140  K 4.0 3.6 3.8 3.6 3.5  CL 104 105 107 106 107  CO2 20* 23 21* 22 23  GLUCOSE 190* 160* 158* 164* 163*  BUN 17 17 19 19 19   CREATININE 1.04 1.04 0.97 1.02 1.09  CALCIUM 9.6 9.4 9.7 9.4 9.1   GFR: Estimated Creatinine Clearance: 101.4 mL/min (by C-G formula based on SCr of 1.09 mg/dL). Liver Function Tests: No results for input(s): "AST", "ALT", "ALKPHOS", "BILITOT", "PROT", "ALBUMIN" in the last 168 hours. No results for input(s): "LIPASE", "AMYLASE" in the last 168 hours. No results for input(s): "AMMONIA" in the last 168 hours. Coagulation Profile: No results for input(s): "INR",  "PROTIME" in the last 168 hours. Cardiac Enzymes: No results for input(s): "CKTOTAL", "CKMB", "CKMBINDEX", "TROPONINI" in the last 168 hours. BNP (last 3 results) No results for input(s): "PROBNP" in the last 8760 hours. HbA1C: Recent Labs    08/21/23 2003  HGBA1C 12.4*   CBG: Recent Labs  Lab 08/23/23 0741 08/23/23 0837 08/23/23 0943 08/23/23 1043 08/23/23 1152  GLUCAP 179* 172* 165* 180* 180*   Lipid Profile: Recent Labs    08/22/23 0111  CHOL 230*  HDL 34*  LDLCALC 151*  TRIG 223*  CHOLHDL 6.8   Thyroid Function Tests: No results for input(s): "TSH", "T4TOTAL", "FREET4", "T3FREE", "THYROIDAB" in the last 72 hours.  Anemia Panel: No results for input(s): "VITAMINB12", "FOLATE", "FERRITIN", "TIBC", "IRON", "RETICCTPCT" in the last 72 hours. Most Recent Urinalysis On File:     Component Value Date/Time   COLORURINE STRAW (A) 08/22/2023 0014   APPEARANCEUR CLEAR (A) 08/22/2023 0014   LABSPEC 1.028 08/22/2023 0014   PHURINE 5.0 08/22/2023 0014   GLUCOSEU >=500 (A) 08/22/2023 0014   GLUCOSEU NEGATIVE 01/15/2023 1320   HGBUR SMALL (A) 08/22/2023 0014   BILIRUBINUR NEGATIVE 08/22/2023 0014   KETONESUR 80 (A) 08/22/2023 0014   PROTEINUR 30 (A) 08/22/2023 0014   UROBILINOGEN 4.0 (A) 01/15/2023 1320   NITRITE NEGATIVE 08/22/2023 0014   LEUKOCYTESUR NEGATIVE 08/22/2023 0014   Sepsis Labs: @LABRCNTIP (procalcitonin:4,lacticidven:4) Microbiology: Recent Results (from the past 240 hours)  MRSA Next Gen by PCR, Nasal     Status: None   Collection Time: 08/22/23  1:14 AM   Specimen: Nasal Mucosa; Nasal Swab  Result Value Ref Range Status   MRSA by PCR Next Gen NOT DETECTED NOT DETECTED Final    Comment: (NOTE) The GeneXpert MRSA Assay (FDA approved for NASAL specimens only), is one component of a comprehensive MRSA colonization surveillance program. It is not intended to diagnose MRSA infection nor to guide or monitor treatment for MRSA infections. Test performance  is not FDA approved in patients less than 57 years old. Performed at [facility], [address] [place], [place] [address]       Radiology Studies last 3 days: DG Chest 2 View Result Date: 08/21/2023 CLINICAL DATA:  Chest pain EXAM: CHEST - 2 VIEW COMPARISON:  None Available. FINDINGS: The heart size and mediastinal contours are within normal limits. Both lungs are clear. The visualized skeletal structures are unremarkable. IMPRESSION: No active cardiopulmonary disease. Electronically Signed   By: Worthy Heads M.D.   On: 08/21/2023 21:22       Ogden Handlin, DO [facility] 08/23/2023, 2:55 PM    Dictation software may have been used to generate the above note. Typos may occur and escape review in typed/dictated notes. Please contact Dr Authur Leghorn directly for clarity if needed.  Staff may message me via secure chat in Epic  but this may not receive an immediate response,  please page me for urgent matters!  If 7PM-7AM, please contact night coverage [email or web address]

## 2023-08-23 NOTE — Inpatient Diabetes Management (Signed)
 Inpatient Diabetes Program Recommendations  AACE/ADA: New Consensus Statement on Inpatient Glycemic Control   Target Ranges:  Prepandial:   less than 140 mg/dL      Peak postprandial:   less than 180 mg/dL (1-2 hours)      Critically ill patients:  140 - 180 mg/dL    Latest Reference Range & Units 08/23/23 01:55 08/23/23 02:47 08/23/23 03:52 08/23/23 05:06 08/23/23 06:28 08/23/23 07:41  Glucose-Capillary 70 - 99 mg/dL 161 (H) 096 (H) 045 (H) 183 (H) 196 (H) 179 (H)   Review of Glycemic Control  Diabetes history: New DM dx this admission Outpatient Diabetes medications: NA Current orders for Inpatient glycemic control: IV insulin   Inpatient Diabetes Program Recommendations:    Insulin : Once MD is ready to transition from IV to SQ insulin , please consider ordering Semglee  20 units Q24H (based on 131.5 kg x 0.15 units), CBGs Q4H, and Novolog  0-9 units Q4H. Once patient is eating an tolerating diet, may need to add Novolog  meal coverage.  Discharge Recommendations: Other recommendations: Freestyle Libre 3 Glucose sensor: 409811  Freestyle Libre 3 Reader: [phone] Long acting recommendations: Insulin  Glargine (LANTUS ) Solostar Pen Dose TBD by MD at time of discharge  Short acting recommendations:  Meal coverage ONLY Insulin  aspart (NOVOLOG ) FlexPen  Dose TBD by MD at time of discharge   Supply/Referral recommendations: Glucometer Test strips Lancet device Lancets Pen needles - standard   Use Adult Diabetes Insulin  Treatment Post Discharge order set.  NOTE: Noted consult for Diabetes Coordinator. Diabetes Coordinator is not on campus over the weekend but available by pager from 8am to 5pm for questions or concerns. Chart reviewed.   Thanks, Beacher Limerick, RN, MSN, CDCES Diabetes Coordinator Inpatient Diabetes Program [phone] (Team Pager from 8am to 5pm)

## 2023-08-24 DIAGNOSIS — E1110 Type 2 diabetes mellitus with ketoacidosis without coma: Secondary | ICD-10-CM | POA: Diagnosis not present

## 2023-08-24 LAB — BASIC METABOLIC PANEL WITH GFR
Anion gap: 9 (ref 5–15)
BUN: 19 mg/dL (ref 6–20)
CO2: 24 mmol/L (ref 22–32)
Calcium: 8.4 mg/dL — ABNORMAL LOW (ref 8.9–10.3)
Chloride: 103 mmol/L (ref 98–111)
Creatinine, Ser: 1.05 mg/dL (ref 0.61–1.24)
GFR, Estimated: >60 mL/min (ref >60)
Glucose, Bld: 261 mg/dL — ABNORMAL HIGH (ref 70–99)
Potassium: 3.3 mmol/L — ABNORMAL LOW (ref 3.5–5.1)
Sodium: 136 mmol/L (ref 135–145)

## 2023-08-24 LAB — GLUCOSE, CAPILLARY
Glucose-Capillary: 224 mg/dL — ABNORMAL HIGH (ref 70–99)
Glucose-Capillary: 255 mg/dL — ABNORMAL HIGH (ref 70–99)
Glucose-Capillary: 235 mg/dL — ABNORMAL HIGH (ref 70–99)
Glucose-Capillary: 251 mg/dL — ABNORMAL HIGH (ref 70–99)
Glucose-Capillary: 211 mg/dL — ABNORMAL HIGH (ref 70–99)

## 2023-08-24 LAB — BETA-HYDROXYBUTYRIC ACID: Beta-Hydroxybutyric Acid: 1.28 mmol/L — ABNORMAL HIGH (ref 0.05–0.27)

## 2023-08-24 MED ORDER — CYCLOBENZAPRINE HCL 10 MG PO TABS
10.0000 mg | ORAL_TABLET | Freq: Three times a day (TID) | ORAL | Status: DC
  Administered 2023-08-24 – 2023-08-26 (×7): 10 mg via ORAL
  Filled 2023-08-24 (×7): qty 1

## 2023-08-24 MED ORDER — INSULIN GLARGINE-YFGN 100 UNIT/ML ~~LOC~~ SOLN
28.0000 [IU] | Freq: Every day | SUBCUTANEOUS | Status: DC
  Administered 2023-08-25: 28 [IU] via SUBCUTANEOUS
  Filled 2023-08-24 (×2): qty 0.28

## 2023-08-24 MED ORDER — LACTATED RINGERS IV SOLN: INTRAVENOUS | Status: AC

## 2023-08-24 MED ORDER — SCOPOLAMINE 1 MG/3DAYS TD PT72
1.0000 | MEDICATED_PATCH | TRANSDERMAL | Status: DC
  Administered 2023-08-24: 1.5 mg via TRANSDERMAL
  Filled 2023-08-24: qty 1

## 2023-08-24 NOTE — Plan of Care (Signed)
 Patient alert & oriented, cooperative but agitated. Frequent bouts of nausea avoiding eating drinking a lot of ice water. IV C/D/I. Nausea treated with PRN zofran  and phenergan  with some relief as patient was able to sleep several hours after Tx. Safety precautions maintained.  Problem: Coping: Goal: Ability to adjust to condition or change in health will improve Outcome: Progressing   Problem: Fluid Volume: Goal: Ability to maintain a balanced intake and output will improve Outcome: Progressing   Problem: Health Behavior/Discharge Planning: Goal: Ability to identify and utilize available resources and services will improve Outcome: Progressing Goal: Ability to manage health-related needs will improve Outcome: Progressing

## 2023-08-24 NOTE — Inpatient Diabetes Management (Signed)
 Inpatient Diabetes Program Recommendations  AACE/ADA: New Consensus Statement on Inpatient Glycemic Control   Target Ranges:  Prepandial:   less than 140 mg/dL      Peak postprandial:   less than 180 mg/dL (1-2 hours)      Critically ill patients:  140 - 180 mg/dL    Latest Reference Range & Units 08/23/23 10:43 08/23/23 11:52 08/23/23 16:54 08/23/23 19:53 08/23/23 23:26 08/24/23 02:55 08/24/23 07:58  Glucose-Capillary 70 - 99 mg/dL 161 (H) 096 (H) 045 (H) 270 (H) 223 (H) 224 (H) 255 (H)   Review of Glycemic Control  Diabetes history: New DM dx this admission Outpatient Diabetes medications: NA Current orders for Inpatient glycemic control: Semglee  20 units daily, Novolog  0-9 units Q4H   Inpatient Diabetes Program Recommendations:     Insulin : Per note by K. Amen, RN this morning, patient continues to have nausea and is avoiding eating.  Please consider increasing Semglee  28 units daily.   Discharge Recommendations: Other recommendations: Freestyle Libre 3 Glucose sensor: 409811  Freestyle Libre 3 Reader: [phone] Long acting recommendations: Insulin  Glargine (LANTUS ) Solostar Pen Dose TBD by MD at time of discharge  Short acting recommendations:  Meal coverage ONLY Insulin  aspart (NOVOLOG ) FlexPen  Dose TBD by MD at time of discharge   Supply/Referral recommendations: Glucometer Test strips Lancet device Lancets Pen needles - standard   Use Adult Diabetes Insulin  Treatment Post Discharge order set.  NOTE: Noted consult for Diabetes Coordinator. Diabetes Coordinator is not on campus over the weekend but available by pager from 8am to 5pm for questions or concerns.   Thanks, Beacher Limerick, RN, MSN, CDCES Diabetes Coordinator Inpatient Diabetes Program [phone] (Team Pager from 8am to 5pm)

## 2023-08-24 NOTE — Progress Notes (Signed)
 PROGRESS NOTE    Barry Raymond   ZOX:[id number] DOB: August 26, 1965  DOA: 08/21/2023 Date of Service: 08/24/23 which is hospital day 2  PCP: Bluford Burkitt, NP    Hospital course / significant events:   HPI: Barry Raymond is a 58 y.o. male with medical history significant of tobacco abuse, cocaine abuse, morbid obesity, COPD, GERD, BPH, chronic back pain, who presents with polyuria, polydipsia, generalized weakness, nausea, vomiting, chest pain.   Of note, last A1C 01/2023 was 6.4/prediabetic.   04/17: to ED. (+)DKA. Admitted to hospitalist service.  04/18: remains on insulin  drip, BHB still high into this afternoon, continue per DKA protocol on insulin  drip until BHB normalized but AG is improving and Glc has been stable 04/19: off insulin  drip and onto sq, advancing diet as tolerated, continues to have significant nausea but reports it's a bit better today  04/20: still nauseous, not taking po.    Consultants:  none  Procedures/Surgeries: none     ASSESSMENT & PLAN:   Diabetic Ketoacidosis DKA resolved, remains hyperglycemic BMP and Beta Hydroxybutyrate repeat in AM increase long acting insulin  semglee  sliding scale novolog  q4h until tolerating diet then can do achs  Plan stop fluids once tolerating po diet  Chest pain - resolved Have ruled out ACS ASA 81 mg daily As needed nitroglycerin , Percocet for pain   COPD (chronic obstructive pulmonary disease) Stable as needed albuterol  and Mucinex    Chronic back pain As needed Percocet, Tylenol  for pain Flexeril  for muscle spasm   Tobacco abuse and Cocaine abuse Nicotine  patch Counseling about importance of quitting substance use    Class 3 obesity based on BMI: Body mass index is 44.08 kg/m.  Underweight - under 18  overweight - 25 to 29 obese - 30 or more Class 1 obesity: BMI of 30.0 to 34 Class 2 obesity: BMI of 35.0 to 39 Class 3 obesity: BMI of 40.0 to 49 Super Morbid Obesity: BMI 50-59 Super-super Morbid  Obesity: BMI 60+ Significantly low or high BMI is associated with higher medical risk.  Weight management advised as adjunct to other disease management and risk reduction treatments    DVT prophylaxis: lovenox  - pt is refusing this  IV fluids: D5LR continuous IV fluids as above can d/c once eating Nutrition: NPO pending off insulin  drip --> advance diet as toelrated  Central lines / other devices: none  Code Status: FULL CODE ACP documentation reviewed: none on file in [facility]  TOC needs: TBD but expect home, may need med assistance Medical barriers to dispo: DKA. Expected medical readiness for discharge next 1-2 days.              Subjective / Brief ROS:  Patient reports nausea is persisting  Hiccups are bothering him  Back pain is stable, this issue is chronic  Denies CP/SOB.  Denies new weakness. .  Reports no concerns w/ urination/defecation.   Family Communication: none at this time     Objective Findings:  Vitals:   08/23/23 1940 08/24/23 0106 08/24/23 0252 08/24/23 0847  BP: 135/71 (!) 138/91 111/89 125/78  Pulse: 91 80 91 76  Resp: 18 18 16 16   Temp:  98.4 F (36.9 C) 98.4 F (36.9 C) 98.3 F (36.8 C)  TempSrc:   Oral Oral  SpO2: 97% 100% 100% 98%  Weight:      Height:        Intake/Output Summary (Last 24 hours) at 08/24/2023 1400 Last data filed at 08/24/2023 1235 Gross per 24 hour  Intake 134.25 ml  Output 575 ml  Net -440.75 ml   Filed Weights   08/21/23 2001 08/22/23 0049  Weight: (!) 140.6 kg 131.5 kg    Examination:  Physical Exam Constitutional:      General: He is not in acute distress.    Appearance: He is obese.  Cardiovascular:     Rate and Rhythm: Normal rate and regular rhythm.  Pulmonary:     Effort: Pulmonary effort is normal. No tachypnea or accessory muscle usage.  Musculoskeletal:     Right lower leg: No edema.     Left lower leg: No edema.  Neurological:     General: No focal deficit present.     Mental  Status: He is alert and oriented to person, place, and time.  Psychiatric:        Mood and Affect: Mood normal.        Behavior: Behavior normal.          Scheduled Medications:   aspirin  EC  81 mg Oral Daily   cyclobenzaprine   10 mg Oral TID   enoxaparin  (LOVENOX ) injection  70 mg Subcutaneous Q24H   insulin  aspart  0-9 Units Subcutaneous Q4H   [START ON 08/25/2023] insulin  glargine-yfgn  28 Units Subcutaneous Daily   nicotine   21 mg Transdermal Daily   pantoprazole   40 mg Oral Q1200   scopolamine   1 patch Transdermal Q72H    Continuous Infusions:  lactated ringers  125 mL/hr at 08/24/23 1027   promethazine  (PHENERGAN ) injection (IM or IVPB) Stopped (08/24/23 1055)    PRN Medications:  acetaminophen , albuterol , alum & mag hydroxide-simeth, dextromethorphan-guaiFENesin , dextrose , hydrALAZINE , nitroGLYCERIN , ondansetron  (ZOFRAN ) IV, oxyCODONE -acetaminophen , promethazine  (PHENERGAN ) injection (IM or IVPB)  Antimicrobials from admission:  Anti-infectives (From admission, onward)    None           Data Reviewed:  I have personally reviewed the following...  CBC: Recent Labs  Lab 08/21/23 2003  WBC 10.2  HGB 17.9*  HCT 52.2*  MCV 87.0  PLT 228   Basic Metabolic Panel: Recent Labs  Lab 08/22/23 1251 08/22/23 1643 08/22/23 2108 08/23/23 0742 08/24/23 0655  NA 138 140 141 140 136  K 3.6 3.8 3.6 3.5 3.3*  CL 105 107 106 107 103  CO2 23 21* 22 23 24   GLUCOSE 160* 158* 164* 163* 261*  BUN 17 19 19 19 19   CREATININE 1.04 0.97 1.02 1.09 1.05  CALCIUM 9.4 9.7 9.4 9.1 8.4*   GFR: Estimated Creatinine Clearance: 105.2 mL/min (by C-G formula based on SCr of 1.05 mg/dL). Liver Function Tests: No results for input(s): "AST", "ALT", "ALKPHOS", "BILITOT", "PROT", "ALBUMIN" in the last 168 hours. No results for input(s): "LIPASE", "AMYLASE" in the last 168 hours. No results for input(s): "AMMONIA" in the last 168 hours. Coagulation Profile: No results for  input(s): "INR", "PROTIME" in the last 168 hours. Cardiac Enzymes: No results for input(s): "CKTOTAL", "CKMB", "CKMBINDEX", "TROPONINI" in the last 168 hours. BNP (last 3 results) No results for input(s): "PROBNP" in the last 8760 hours. HbA1C: Recent Labs    08/21/23 2003  HGBA1C 12.4*   CBG: Recent Labs  Lab 08/23/23 1953 08/23/23 2326 08/24/23 0255 08/24/23 0758 08/24/23 1200  GLUCAP 270* 223* 224* 255* 235*   Lipid Profile: Recent Labs    08/22/23 0111  CHOL 230*  HDL 34*  LDLCALC 151*  TRIG 223*  CHOLHDL 6.8   Thyroid Function Tests: No results for input(s): "TSH", "T4TOTAL", "FREET4", "T3FREE", "THYROIDAB" in the last 72 hours.  Anemia Panel: No results for input(s): "VITAMINB12", "FOLATE", "FERRITIN", "TIBC", "IRON", "RETICCTPCT" in the last 72 hours. Most Recent Urinalysis On File:     Component Value Date/Time   COLORURINE STRAW (A) 08/22/2023 0014   APPEARANCEUR CLEAR (A) 08/22/2023 0014   LABSPEC 1.028 08/22/2023 0014   PHURINE 5.0 08/22/2023 0014   GLUCOSEU >=500 (A) 08/22/2023 0014   GLUCOSEU NEGATIVE 01/15/2023 1320   HGBUR SMALL (A) 08/22/2023 0014   BILIRUBINUR NEGATIVE 08/22/2023 0014   KETONESUR 80 (A) 08/22/2023 0014   PROTEINUR 30 (A) 08/22/2023 0014   UROBILINOGEN 4.0 (A) 01/15/2023 1320   NITRITE NEGATIVE 08/22/2023 0014   LEUKOCYTESUR NEGATIVE 08/22/2023 0014   Sepsis Labs: @LABRCNTIP (procalcitonin:4,lacticidven:4) Microbiology: Recent Results (from the past 240 hours)  MRSA Next Gen by PCR, Nasal     Status: None   Collection Time: 08/22/23  1:14 AM   Specimen: Nasal Mucosa; Nasal Swab  Result Value Ref Range Status   MRSA by PCR Next Gen NOT DETECTED NOT DETECTED Final    Comment: (NOTE) The GeneXpert MRSA Assay (FDA approved for NASAL specimens only), is one component of a comprehensive MRSA colonization surveillance program. It is not intended to diagnose MRSA infection nor to guide or monitor treatment for MRSA  infections. Test performance is not FDA approved in patients less than 89 years old. Performed at [facility], [address], [place], [place] [address]       Radiology Studies last 3 days: DG Chest 2 View Result Date: 08/21/2023 CLINICAL DATA:  Chest pain EXAM: CHEST - 2 VIEW COMPARISON:  None Available. FINDINGS: The heart size and mediastinal contours are within normal limits. Both lungs are clear. The visualized skeletal structures are unremarkable. IMPRESSION: No active cardiopulmonary disease. Electronically Signed   By: Worthy Heads M.D.   On: 08/21/2023 21:22       Aniello Christopoulos, DO [facility] 08/24/2023, 2:00 PM    Dictation software may have been used to generate the above note. Typos may occur and escape review in typed/dictated notes. Please contact Dr Authur Leghorn directly for clarity if needed.  Staff may message me via secure chat in Epic  but this may not receive an immediate response,  please page me for urgent matters!  If 7PM-7AM, please contact night coverage [email or web address]

## 2023-08-24 NOTE — Plan of Care (Signed)
  Problem: Metabolic: Goal: Ability to maintain appropriate glucose levels will improve Outcome: Progressing   Problem: Skin Integrity: Goal: Risk for impaired skin integrity will decrease Outcome: Progressing   Problem: Tissue Perfusion: Goal: Adequacy of tissue perfusion will improve Outcome: Progressing   Problem: Urinary Elimination: Goal: Ability to achieve and maintain adequate renal perfusion and functioning will improve Outcome: Progressing   Problem: Nutritional: Goal: Maintenance of adequate nutrition will improve Outcome: Not Progressing  Pt verbalized that he does not want anything to eat just drink Problem: Nutritional: Goal: Maintenance of adequate nutrition will improve Outcome: Not Progressing   Problem: Education: Goal: Individualized Educational Video(s) Outcome: Not Applicable   Problem: Education: Goal: Individualized Educational Video(s) Outcome: Not Applicable

## 2023-08-25 ENCOUNTER — Inpatient Hospital Stay

## 2023-08-25 DIAGNOSIS — E1110 Type 2 diabetes mellitus with ketoacidosis without coma: Secondary | ICD-10-CM | POA: Diagnosis not present

## 2023-08-25 LAB — BASIC METABOLIC PANEL WITH GFR
Anion gap: 8 (ref 5–15)
BUN: 16 mg/dL (ref 6–20)
CO2: 28 mmol/L (ref 22–32)
Calcium: 8.2 mg/dL — ABNORMAL LOW (ref 8.9–10.3)
Chloride: 101 mmol/L (ref 98–111)
Creatinine, Ser: 0.95 mg/dL (ref 0.61–1.24)
GFR, Estimated: >60 mL/min (ref >60)
Glucose, Bld: 212 mg/dL — ABNORMAL HIGH (ref 70–99)
Potassium: 3.1 mmol/L — ABNORMAL LOW (ref 3.5–5.1)
Sodium: 137 mmol/L (ref 135–145)

## 2023-08-25 LAB — GLUCOSE, CAPILLARY
Glucose-Capillary: 201 mg/dL — ABNORMAL HIGH (ref 70–99)
Glucose-Capillary: 205 mg/dL — ABNORMAL HIGH (ref 70–99)
Glucose-Capillary: 206 mg/dL — ABNORMAL HIGH (ref 70–99)
Glucose-Capillary: 230 mg/dL — ABNORMAL HIGH (ref 70–99)
Glucose-Capillary: 240 mg/dL — ABNORMAL HIGH (ref 70–99)
Glucose-Capillary: 242 mg/dL — ABNORMAL HIGH (ref 70–99)
Glucose-Capillary: 274 mg/dL — ABNORMAL HIGH (ref 70–99)
Glucose-Capillary: 282 mg/dL — ABNORMAL HIGH (ref 70–99)

## 2023-08-25 LAB — BETA-HYDROXYBUTYRIC ACID: Beta-Hydroxybutyric Acid: 1.22 mmol/L — ABNORMAL HIGH (ref 0.05–0.27)

## 2023-08-25 MED ORDER — LIVING WELL WITH DIABETES BOOK
Freq: Once | Status: DC
  Filled 2023-08-25: qty 1

## 2023-08-25 MED ORDER — POTASSIUM CHLORIDE 10 MEQ/100ML IV SOLN
10.0000 meq | INTRAVENOUS | Status: AC
  Administered 2023-08-25 (×3): 10 meq via INTRAVENOUS
  Filled 2023-08-25 (×2): qty 100

## 2023-08-25 MED ORDER — IOHEXOL 300 MG/ML  SOLN
100.0000 mL | Freq: Once | INTRAMUSCULAR | Status: AC | PRN
  Administered 2023-08-25: 100 mL via INTRAVENOUS

## 2023-08-25 MED ORDER — SODIUM CHLORIDE 0.9 % IV SOLN: INTRAVENOUS | Status: AC

## 2023-08-25 MED ORDER — INSULIN STARTER KIT- PEN NEEDLES (ENGLISH)
1.0000 | Freq: Once | Status: DC
  Filled 2023-08-25: qty 1

## 2023-08-25 NOTE — Plan of Care (Signed)
  Problem: Education: Goal: Ability to describe self-care measures that may prevent or decrease complications (Diabetes Survival Skills Education) will improve Outcome: Progressing   Problem: Coping: Goal: Ability to adjust to condition or change in health will improve Outcome: Progressing   Problem: Fluid Volume: Goal: Ability to maintain a balanced intake and output will improve Outcome: Progressing   Problem: Health Behavior/Discharge Planning: Goal: Ability to identify and utilize available resources and services will improve Outcome: Progressing Goal: Ability to manage health-related needs will improve Outcome: Progressing   Problem: Metabolic: Goal: Ability to maintain appropriate glucose levels will improve Outcome: Progressing   Problem: Nutritional: Goal: Maintenance of adequate nutrition will improve Outcome: Progressing Goal: Progress toward achieving an optimal weight will improve Outcome: Progressing   Problem: Skin Integrity: Goal: Risk for impaired skin integrity will decrease Outcome: Progressing   Problem: Tissue Perfusion: Goal: Adequacy of tissue perfusion will improve Outcome: Progressing   Problem: Education: Goal: Ability to describe self-care measures that may prevent or decrease complications (Diabetes Survival Skills Education) will improve Outcome: Progressing   Problem: Cardiac: Goal: Ability to maintain an adequate cardiac output will improve Outcome: Progressing   Problem: Health Behavior/Discharge Planning: Goal: Ability to identify and utilize available resources and services will improve Outcome: Progressing Goal: Ability to manage health-related needs will improve Outcome: Progressing   Problem: Fluid Volume: Goal: Ability to achieve a balanced intake and output will improve Outcome: Progressing   Problem: Metabolic: Goal: Ability to maintain appropriate glucose levels will improve Outcome: Progressing   Problem:  Nutritional: Goal: Maintenance of adequate nutrition will improve Outcome: Progressing Goal: Maintenance of adequate weight for body size and type will improve Outcome: Progressing   Problem: Respiratory: Goal: Will regain and/or maintain adequate ventilation Outcome: Progressing   Problem: Urinary Elimination: Goal: Ability to achieve and maintain adequate renal perfusion and functioning will improve Outcome: Progressing   Problem: Education: Goal: Knowledge of General Education information will improve Description: Including pain rating scale, medication(s)/side effects and non-pharmacologic comfort measures Outcome: Progressing   Problem: Health Behavior/Discharge Planning: Goal: Ability to manage health-related needs will improve Outcome: Progressing   Problem: Clinical Measurements: Goal: Ability to maintain clinical measurements within normal limits will improve Outcome: Progressing Goal: Will remain free from infection Outcome: Progressing Goal: Diagnostic test results will improve Outcome: Progressing Goal: Respiratory complications will improve Outcome: Progressing Goal: Cardiovascular complication will be avoided Outcome: Progressing   Problem: Activity: Goal: Risk for activity intolerance will decrease Outcome: Progressing   Problem: Nutrition: Goal: Adequate nutrition will be maintained Outcome: Progressing   Problem: Coping: Goal: Level of anxiety will decrease Outcome: Progressing   Problem: Elimination: Goal: Will not experience complications related to bowel motility Outcome: Progressing Goal: Will not experience complications related to urinary retention Outcome: Progressing   Problem: Pain Managment: Goal: General experience of comfort will improve and/or be controlled Outcome: Progressing   Problem: Safety: Goal: Ability to remain free from injury will improve Outcome: Progressing   Problem: Skin Integrity: Goal: Risk for impaired skin  integrity will decrease Outcome: Progressing

## 2023-08-25 NOTE — Progress Notes (Signed)
 PROGRESS NOTE    Barry Raymond   ZOX:[id number] DOB: [birth year]  DOA: 08/21/2023 Date of Service: 08/25/23 which is hospital day 3  PCP: Bluford Burkitt, NP    Hospital course / significant events:   HPI: Barry Raymond is a 58 y.o. male with medical history significant of tobacco abuse, cocaine abuse, morbid obesity, COPD, GERD, BPH, chronic back pain, who presents with polyuria, polydipsia, generalized weakness, nausea, vomiting, chest pain.   Of note, last A1C 01/2023 was 6.4/prediabetic.   04/17: to ED. (+)DKA. Admitted to hospitalist service.  04/18: remains on insulin  drip, BHB still high into this afternoon, continue per DKA protocol on insulin  drip until BHB normalized but AG is improving and Glc has been stable 04/19: off insulin  drip and onto sq, advancing diet as tolerated, continues to have significant nausea but reports it's a bit better today  04/20: still nauseous, not taking po.  04/21: still nauseous, not taking po. CT abd/pelv ordered    Consultants:  none  Procedures/Surgeries: none     ASSESSMENT & PLAN:   Diabetic Ketoacidosis DKA resolved, remains hyperglycemic BMP and Beta Hydroxybutyrate repeat in AM increase long acting insulin  semglee  sliding scale novolog  q4h until tolerating diet then can do achs  Plan stop fluids once tolerating po diet  Persistent nausea, abdominal discomfort, hiccups  Scopolamine  patch in place Zofran  prn / pre-meal phenergan  for breakthrough nausea  CT abd/pelvis pending   Chest pain - resolved Have ruled out ACS ASA 81 mg daily As needed nitroglycerin , Percocet for pain   COPD (chronic obstructive pulmonary disease) Stable as needed albuterol  and Mucinex    Chronic back pain As needed Percocet, Tylenol  for pain Flexeril  for muscle spasm   Tobacco abuse and Cocaine abuse Nicotine  patch Counseling about importance of quitting substance use    Class 3 obesity based on BMI: Body mass index is 44.08 kg/m.   Underweight - under 18  overweight - 25 to 29 obese - 30 or more Class 1 obesity: BMI of 30.0 to 34 Class 2 obesity: BMI of 35.0 to 39 Class 3 obesity: BMI of 40.0 to 49 Super Morbid Obesity: BMI 50-59 Super-super Morbid Obesity: BMI 60+ Significantly low or high BMI is associated with higher medical risk.  Weight management advised as adjunct to other disease management and risk reduction treatments    DVT prophylaxis: lovenox  - pt is refusing this  IV fluids: D5LR continuous IV fluids as above can d/c once eating Nutrition: NPO pending off insulin  drip --> advance diet as toelrated  Central lines / other devices: none  Code Status: FULL CODE ACP documentation reviewed: none on file in [facility]  TOC needs: TBD but expect home Medical barriers to dispo: pesistent nausea and not tolerating po             Subjective / Brief ROS:  Patient reports nausea is persisting  Hiccups are bothering him  Back pain is stable, this issue is chronic  Denies CP/SOB.  Denies new weakness. .  Reports no concerns w/ urination/defecation.   Family Communication: none at this time     Objective Findings:  Vitals:   08/24/23 2047 08/25/23 0533 08/25/23 0726 08/25/23 1554  BP: 128/74 (!) 142/86 125/79 115/78  Pulse: 77 65 73 87  Resp: 18 18 18 18   Temp: 98.3 F (36.8 C) 98.3 F (36.8 C) 98.1 F (36.7 C) 98.3 F (36.8 C)  TempSrc: Oral     SpO2: 94% 97% 95% 96%  Weight:  Height:        Intake/Output Summary (Last 24 hours) at 08/25/2023 1644 Last data filed at 08/25/2023 1513 Gross per 24 hour  Intake 1943.46 ml  Output 525 ml  Net 1418.46 ml   Filed Weights   08/21/23 2001 08/22/23 0049  Weight: (!) 140.6 kg 131.5 kg    Examination:  Physical Exam Constitutional:      General: He is not in acute distress.    Appearance: He is obese.  Cardiovascular:     Rate and Rhythm: Normal rate and regular rhythm.  Pulmonary:     Effort: Pulmonary effort is normal. No  tachypnea or accessory muscle usage.  Musculoskeletal:     Right lower leg: No edema.     Left lower leg: No edema.  Neurological:     General: No focal deficit present.     Mental Status: He is alert and oriented to person, place, and time.  Psychiatric:        Mood and Affect: Mood normal.        Behavior: Behavior normal.          Scheduled Medications:   aspirin  EC  81 mg Oral Daily   cyclobenzaprine   10 mg Oral TID   enoxaparin  (LOVENOX ) injection  70 mg Subcutaneous Q24H   insulin  aspart  0-9 Units Subcutaneous Q4H   insulin  glargine-yfgn  28 Units Subcutaneous Daily   insulin  starter kit- pen needles  1 kit Other Once   living well with diabetes book   Does not apply Once   nicotine   21 mg Transdermal Daily   pantoprazole   40 mg Oral Q1200   scopolamine   1 patch Transdermal Q72H    Continuous Infusions:  promethazine  (PHENERGAN ) injection (IM or IVPB) Stopped (08/25/23 1102)    PRN Medications:  acetaminophen , albuterol , alum & mag hydroxide-simeth, dextromethorphan-guaiFENesin , dextrose , hydrALAZINE , nitroGLYCERIN , ondansetron  (ZOFRAN ) IV, oxyCODONE -acetaminophen , promethazine  (PHENERGAN ) injection (IM or IVPB)  Antimicrobials from admission:  Anti-infectives (From admission, onward)    None           Data Reviewed:  I have personally reviewed the following...  CBC: Recent Labs  Lab 08/21/23 2003  WBC 10.2  HGB 17.9*  HCT 52.2*  MCV 87.0  PLT 228   Basic Metabolic Panel: Recent Labs  Lab 08/22/23 1643 08/22/23 2108 08/23/23 0742 08/24/23 0655 08/25/23 0554  NA 140 141 140 136 137  K 3.8 3.6 3.5 3.3* 3.1*  CL 107 106 107 103 101  CO2 21* 22 23 24 28   GLUCOSE 158* 164* 163* 261* 212*  BUN 19 19 19 19 16   CREATININE 0.97 1.02 1.09 1.05 0.95  CALCIUM 9.7 9.4 9.1 8.4* 8.2*   GFR: Estimated Creatinine Clearance: 116.3 mL/min (by C-G formula based on SCr of 0.95 mg/dL). Liver Function Tests: No results for input(s): "AST", "ALT",  "ALKPHOS", "BILITOT", "PROT", "ALBUMIN" in the last 168 hours. No results for input(s): "LIPASE", "AMYLASE" in the last 168 hours. No results for input(s): "AMMONIA" in the last 168 hours. Coagulation Profile: No results for input(s): "INR", "PROTIME" in the last 168 hours. Cardiac Enzymes: No results for input(s): "CKTOTAL", "CKMB", "CKMBINDEX", "TROPONINI" in the last 168 hours. BNP (last 3 results) No results for input(s): "PROBNP" in the last 8760 hours. HbA1C: No results for input(s): "HGBA1C" in the last 72 hours.  CBG: Recent Labs  Lab 08/25/23 0353 08/25/23 0536 08/25/23 0727 08/25/23 1128 08/25/23 1555  GLUCAP 242* 201* 205* 206* 230*   Lipid Profile: No results  for input(s): "CHOL", "HDL", "LDLCALC", "TRIG", "CHOLHDL", "LDLDIRECT" in the last 72 hours.  Thyroid Function Tests: No results for input(s): "TSH", "T4TOTAL", "FREET4", "T3FREE", "THYROIDAB" in the last 72 hours. Anemia Panel: No results for input(s): "VITAMINB12", "FOLATE", "FERRITIN", "TIBC", "IRON", "RETICCTPCT" in the last 72 hours. Most Recent Urinalysis On File:     Component Value Date/Time   COLORURINE STRAW (A) 08/22/2023 0014   APPEARANCEUR CLEAR (A) 08/22/2023 0014   LABSPEC 1.028 08/22/2023 0014   PHURINE 5.0 08/22/2023 0014   GLUCOSEU >=500 (A) 08/22/2023 0014   GLUCOSEU NEGATIVE 01/15/2023 1320   HGBUR SMALL (A) 08/22/2023 0014   BILIRUBINUR NEGATIVE 08/22/2023 0014   KETONESUR 80 (A) 08/22/2023 0014   PROTEINUR 30 (A) 08/22/2023 0014   UROBILINOGEN 4.0 (A) 01/15/2023 1320   NITRITE NEGATIVE 08/22/2023 0014   LEUKOCYTESUR NEGATIVE 08/22/2023 0014   Sepsis Labs: @LABRCNTIP (procalcitonin:4,lacticidven:4) Microbiology: Recent Results (from the past 240 hours)  MRSA Next Gen by PCR, Nasal     Status: None   Collection Time: 08/22/23  1:14 AM   Specimen: Nasal Mucosa; Nasal Swab  Result Value Ref Range Status   MRSA by PCR Next Gen NOT DETECTED NOT DETECTED Final    Comment:  (NOTE) The GeneXpert MRSA Assay (FDA approved for NASAL specimens only), is one component of a comprehensive MRSA colonization surveillance program. It is not intended to diagnose MRSA infection nor to guide or monitor treatment for MRSA infections. Test performance is not FDA approved in patients less than 78 years old. Performed at [facility], [address], [place], [place] [address]       Radiology Studies last 3 days: DG Chest 2 View Result Date: 08/21/2023 CLINICAL DATA:  Chest pain EXAM: CHEST - 2 VIEW COMPARISON:  None Available. FINDINGS: The heart size and mediastinal contours are within normal limits. Both lungs are clear. The visualized skeletal structures are unremarkable. IMPRESSION: No active cardiopulmonary disease. Electronically Signed   By: Worthy Heads M.D.   On: 08/21/2023 21:22       Kelena Garrow, DO [facility] 08/25/2023, 4:44 PM    Dictation software may have been used to generate the above note. Typos may occur and escape review in typed/dictated notes. Please contact Dr Authur Leghorn directly for clarity if needed.  Staff may message me via secure chat in Epic  but this may not receive an immediate response,  please page me for urgent matters!  If 7PM-7AM, please contact night coverage [email or web address]

## 2023-08-25 NOTE — Inpatient Diabetes Management (Addendum)
 Inpatient Diabetes Program Recommendations  AACE/ADA: New Consensus Statement on Inpatient Glycemic Control  Target Ranges:  Prepandial:   less than 140 mg/dL      Peak postprandial:   less than 180 mg/dL (1-2 hours)      Critically ill patients:  140 - 180 mg/dL    Latest Reference Range & Units 08/25/23 00:34 08/25/23 03:53 08/25/23 05:36 08/25/23 07:27  Glucose-Capillary 70 - 99 mg/dL 161 (H) 096 (H) 045 (H) 205 (H)    Latest Reference Range & Units 08/24/23 02:55 08/24/23 07:58 08/24/23 12:00 08/24/23 16:43 08/24/23 20:48  Glucose-Capillary 70 - 99 mg/dL 409 (H) 811 (H) 914 (H) 251 (H) 211 (H)    Review of Glycemic Control  Diabetes history: New DM dx this admission Outpatient Diabetes medications: NA Current orders for Inpatient glycemic control: Semglee  28 units daily, Novolog  0-9 units Q4H  Inpatient Diabetes Program Recommendations:    Insulin : Patient received Semglee  20 units on 08/24/23 and will receive Semglee  28 units today.   Discharge Recommendations: Other recommendations: Freestyle Libre 3 Glucose sensor: [id number]  Freestyle Libre 3 Reader: [phone] Long acting recommendations: Insulin  Glargine (LANTUS ) Solostar Pen Dose TBD by MD at time of discharge  Short acting recommendations:  Meal coverage ONLY Insulin  aspart (NOVOLOG ) FlexPen  Dose TBD by MD at time of discharge   Supply/Referral recommendations: Glucometer Test strips Lancet device Lancets Pen needles - standard   Use Adult Diabetes Insulin  Treatment Post Discharge order set.  NOTE: Per chart, patient is still not eating meals. Will plan to follow up with patient today.  Addendum 08/25/23@11 :45-Spoke with patient at bedside about new diabetes diagnosis.  Reviewed basic home care for DM management, importance of checking CBGs and maintaining good CBG control to prevent long-term and short-term complications. Reviewed glucose and A1C goals.  Reviewed signs and symptoms of hyperglycemia and hypoglycemia  along with treatment for both. Patient states that he has a PCP but has not been seen in a while. Patient reports that he has an appointment with PCP on 09/02/23 for follow up.  Discussed that he will likely be using insulin  outpatient. Educated patient on insulin  pen use at home. Reviewed all steps of insulin  pen including attachment of needle, 2-unit air shot, dialing up dose, giving injection, removing needle, disposal of sharps, storage of unused insulin , disposal of insulin  etc. Patient able to provide successful return demonstration. Discussed that per outpatient [facility] pharmacy, Lantus  and Novolog  pens are covered with insurance. Discussed Lantus  and Novolog  insulin .   Patient reports that he did not receive the Living Well with DM book that was ordered Friday.  Encouraged patient to read through book once received.   Reviewed how to use FreeStyle Libre 3 CGM.  Patient will need a FreeStyle Libre 3 reader to use with the sensors. Patient reports he is still not able to eat much due to nausea; reports he ate very little for breakfast and ended up throwing it up.  Patient verbalized understanding of information discussed and he states that he has no further questions at this time related to diabetes.   RNs to provide ongoing basic DM education at bedside with this patient and engage patient to actively check blood glucose and administer insulin  injections.   Thanks, Beacher Limerick, RN, MSN, CDCES Diabetes Coordinator Inpatient Diabetes Program [phone] (Team Pager from 8am to 5pm)

## 2023-08-26 ENCOUNTER — Other Ambulatory Visit (HOSPITAL_COMMUNITY): Payer: Self-pay

## 2023-08-26 ENCOUNTER — Telehealth (HOSPITAL_COMMUNITY): Payer: Self-pay | Admitting: Pharmacy Technician

## 2023-08-26 ENCOUNTER — Other Ambulatory Visit: Payer: Self-pay

## 2023-08-26 DIAGNOSIS — E1110 Type 2 diabetes mellitus with ketoacidosis without coma: Secondary | ICD-10-CM | POA: Diagnosis not present

## 2023-08-26 LAB — BASIC METABOLIC PANEL WITH GFR
Anion gap: 8 (ref 5–15)
BUN: 14 mg/dL (ref 6–20)
CO2: 25 mmol/L (ref 22–32)
Calcium: 8.1 mg/dL — ABNORMAL LOW (ref 8.9–10.3)
Chloride: 101 mmol/L (ref 98–111)
Creatinine, Ser: 0.98 mg/dL (ref 0.61–1.24)
GFR, Estimated: >60 mL/min (ref >60)
Glucose, Bld: 300 mg/dL — ABNORMAL HIGH (ref 70–99)
Potassium: 3.4 mmol/L — ABNORMAL LOW (ref 3.5–5.1)
Sodium: 134 mmol/L — ABNORMAL LOW (ref 135–145)

## 2023-08-26 LAB — GLUCOSE, CAPILLARY
Glucose-Capillary: 284 mg/dL — ABNORMAL HIGH (ref 70–99)
Glucose-Capillary: 328 mg/dL — ABNORMAL HIGH (ref 70–99)
Glucose-Capillary: 259 mg/dL — ABNORMAL HIGH (ref 70–99)

## 2023-08-26 LAB — BETA-HYDROXYBUTYRIC ACID: Beta-Hydroxybutyric Acid: 0.23 mmol/L (ref 0.05–0.27)

## 2023-08-26 MED ORDER — INSULIN ASPART 100 UNIT/ML FLEXPEN
0.0000 [IU] | PEN_INJECTOR | Freq: Three times a day (TID) | SUBCUTANEOUS | 0 refills | Status: DC
  Filled 2023-08-26: qty 15, 29d supply, fill #0

## 2023-08-26 MED ORDER — NICOTINE 21 MG/24HR TD PT24
21.0000 mg | MEDICATED_PATCH | Freq: Every day | TRANSDERMAL | 0 refills | Status: AC
  Filled 2023-08-26: qty 28, 28d supply, fill #0

## 2023-08-26 MED ORDER — INSULIN GLARGINE 100 UNIT/ML SOLOSTAR PEN
40.0000 [IU] | PEN_INJECTOR | Freq: Every day | SUBCUTANEOUS | 0 refills | Status: DC
  Filled 2023-08-26: qty 15, 37d supply, fill #0

## 2023-08-26 MED ORDER — PROMETHAZINE HCL 25 MG PO TABS: 12.5000 mg | ORAL_TABLET | Freq: Four times a day (QID) | ORAL | Status: DC | PRN

## 2023-08-26 MED ORDER — INSULIN ASPART 100 UNIT/ML IJ SOLN
4.0000 [IU] | Freq: Three times a day (TID) | INTRAMUSCULAR | Status: DC
  Administered 2023-08-26 (×2): 4 [IU] via SUBCUTANEOUS
  Filled 2023-08-26 (×2): qty 1

## 2023-08-26 MED ORDER — INSULIN ASPART 100 UNIT/ML IJ SOLN
0.0000 [IU] | Freq: Three times a day (TID) | INTRAMUSCULAR | Status: DC
  Administered 2023-08-26: 5 [IU] via SUBCUTANEOUS
  Administered 2023-08-26: 7 [IU] via SUBCUTANEOUS
  Filled 2023-08-26 (×2): qty 1

## 2023-08-26 MED ORDER — INSULIN GLARGINE-YFGN 100 UNIT/ML ~~LOC~~ SOLN
40.0000 [IU] | Freq: Every day | SUBCUTANEOUS | Status: DC
  Administered 2023-08-26: 40 [IU] via SUBCUTANEOUS
  Filled 2023-08-26: qty 0.4

## 2023-08-26 MED ORDER — ASPIRIN 81 MG PO TBEC: 81.0000 mg | DELAYED_RELEASE_TABLET | Freq: Every day | ORAL | Status: AC

## 2023-08-26 MED ORDER — PROMETHAZINE HCL 25 MG PO TABS
12.5000 mg | ORAL_TABLET | Freq: Four times a day (QID) | ORAL | 0 refills | Status: DC | PRN
  Filled 2023-08-26: qty 30, 15d supply, fill #0

## 2023-08-26 MED ORDER — FREESTYLE LIBRE READER DEVI
0 refills | Status: DC
  Filled 2023-08-26: qty 1, 30d supply, fill #0
  Filled 2023-08-26: qty 1, fill #0

## 2023-08-26 MED ORDER — PANTOPRAZOLE SODIUM 40 MG PO TBEC
40.0000 mg | DELAYED_RELEASE_TABLET | Freq: Every day | ORAL | 0 refills | Status: AC
  Filled 2023-08-26: qty 30, 30d supply, fill #0

## 2023-08-26 MED ORDER — CYCLOBENZAPRINE HCL 10 MG PO TABS
10.0000 mg | ORAL_TABLET | Freq: Three times a day (TID) | ORAL | 0 refills | Status: AC | PRN
  Filled 2023-08-26: qty 30, 10d supply, fill #0

## 2023-08-26 MED ORDER — INSULIN PEN NEEDLE 32G X 4 MM MISC
1.0000 | Freq: Once | 0 refills | Status: AC
  Filled 2023-08-26: qty 100, 90d supply, fill #0

## 2023-08-26 NOTE — TOC Initial Note (Addendum)
 Transition of Care [facility]) - Initial/Assessment Note    Patient Details  Name: Barry Raymond MRN: [id number] Date of Birth: 10/19/1965  Transition of Care [facility]) CM/SW Contact:    Elsie Halo, RN Phone Number: 08/26/2023, 2:55 PM  Clinical Narrative:                 Patient is from home with his wife and adult children. He uses Psychologist, forensic on [employer]. He is active with  Health for PCP. He doesn't use any equipment.  TOC places resources for food, transportation, housing, and utilites on the AVS. Please outreach to [facility] if needs are identified.   Expected Discharge Plan: Home/Self Care Barriers to Discharge: Continued Medical Work up   Patient Goals and CMS Choice            Expected Discharge Plan and Services   Discharge Planning Services: CM Consult   Living arrangements for the past 2 months: Single Family Home                                      Prior Living Arrangements/Services Living arrangements for the past 2 months: Single Family Home Lives with:: Adult Children, Spouse                   Activities of Daily Living   ADL Screening (condition at time of admission) Independently performs ADLs?: Yes (appropriate for developmental age) Is the patient deaf or have difficulty hearing?: No Does the patient have difficulty seeing, even when wearing glasses/contacts?: No Does the patient have difficulty concentrating, remembering, or making decisions?: No  Permission Sought/Granted                  Emotional Assessment           Psych Involvement: No (comment)  Admission diagnosis:  DKA (diabetic ketoacidosis) (HCC) [E11.10] Diabetic ketoacidosis without coma associated with other specified diabetes mellitus (HCC) [E13.10] Patient Active Problem List   Diagnosis Date Noted   GERD (gastroesophageal reflux disease) 08/22/2023   New onset type 2 diabetes mellitus (HCC) 08/22/2023   DKA (diabetic ketoacidosis)  (HCC) 08/21/2023   COPD (chronic obstructive pulmonary disease) (HCC) 08/21/2023   Chest pain 08/21/2023   Tobacco abuse 08/21/2023   Chronic back pain 08/21/2023   Upper respiratory tract infection 05/25/2023   Bilateral lower extremity edema 05/05/2023   Snoring 01/15/2023   Cocaine abuse (HCC) 01/15/2023   Erectile dysfunction 01/15/2023   Benign prostatic hyperplasia (BPH) with straining on urination 01/15/2023   Morbid obesity with BMI of 40.0-44.9, adult (HCC) 01/15/2023   Chronic midline low back pain without sciatica 12/09/2022   S/P excision of lipoma 11/05/2016   Lipoma of lower back    Lipoma of back    Hematoma    Umbilical hernia 01/03/2015   PCP:  Bluford Burkitt, NP Pharmacy:   [facility] [address]), [place] - [address] [place]) [place] [address] Phone: [phone] Fax: [phone]     Social Drivers of Health (SDOH) Social History: SDOH Screenings   Food Insecurity: Food Insecurity Present (08/22/2023)  Housing: High Risk (08/22/2023)  Transportation Needs: Unmet Transportation Needs (08/22/2023)  Utilities: At Risk (08/22/2023)  Depression (PHQ2-9): Low Risk  (05/22/2023)  Social Connections: Socially Isolated (08/22/2023)  Tobacco Use: High Risk (08/21/2023)   SDOH Interventions:     Readmission Risk  Interventions     No data to display

## 2023-08-26 NOTE — Discharge Summary (Signed)
 Physician Discharge Summary   Patient: Barry Raymond MRN: [id number]  DOB: 03-Dec-1965   Admit:     Date of Admission: 08/21/2023 Admitted from: home   Discharge: Date of discharge: 08/26/23 Disposition: Home Condition at discharge: good  CODE STATUS: FULL CODE     Discharge Physician: Melodi Sprung, DO [facility]     PCP: Bluford Burkitt, NP  Recommendations for Outpatient Follow-up:  Follow up with PCP Bluford Burkitt, NP asap for hospital follow up     Discharge Instructions     AMB Referral VBCI Care Management   Complete by: As directed    Admit DKA/ New diagnosis Diabetes.  A1c= 12.4%.   Expected date of contact: Emergent - 3 Days   Service: Pharmacy   Pharmacy Service For:  Disease Management Medication Adherence     Disease states to manage: Diabetes   Amb ref to Medical Nutrition Therapy-MNT   Complete by: As directed    Choose the type of MNT and number of hours: Initial MNT:  3 hours   Ambulatory referral to Nutrition and Diabetic Education   Complete by: As directed    Admit DKA/ New diagnosis Diabetes.  Likely needs insulin  for home.  A1c= 12.4%.  PCP with [facility].   Diet - low sodium heart healthy   Complete by: As directed    Diet Carb Modified   Complete by: As directed    Increase activity slowly   Complete by: As directed          Discharge Diagnoses: Principal Problem:   DKA (diabetic ketoacidosis) (HCC) Active Problems:   New onset type 2 diabetes mellitus (HCC)   Chest pain   COPD (chronic obstructive pulmonary disease) (HCC)   Chronic back pain   Tobacco abuse   Cocaine abuse (HCC)   Morbid obesity with BMI of 40.0-44.9, adult (HCC)   GERD (gastroesophageal reflux disease)      Hospital course / significant events:   HPI: Barry Raymond is a 58 y.o. male with medical history significant of tobacco abuse, cocaine abuse, morbid obesity, COPD, GERD, BPH, chronic back pain, who presents with polyuria, polydipsia,  generalized weakness, nausea, vomiting, chest pain.   Of note, last A1C 01/2023 was 6.4/prediabetic.   04/17: to ED. (+)DKA. Admitted to hospitalist service.  04/18: remains on insulin  drip, BHB still high into this afternoon, continue per DKA protocol on insulin  drip until BHB normalized but AG is improving and Glc has been stable 04/19: off insulin  drip and onto sq, advancing diet as tolerated, continues to have significant nausea but reports it's a bit better today  04/20: still nauseous, not taking po.  04/21: still nauseous, not taking po. CT abd/pelv ordered and no concerns  04/22: doing much better today, eating without nausea, glc up likely d/t increased po intake. Reinforced insulin /DM2 education today and pt stable for discharge home    Consultants:  none  Procedures/Surgeries: none     ASSESSMENT & PLAN:   Diabetic Ketoacidosis DKA resolved, remains hyperglycemic Continuous glucose monitor  long acting insulin  semglee  sliding scale novolog  Will need close follow up outpatient   Persistent nausea, abdominal discomfort - resolved Scopolamine  patch in place can remove tomorrow phenergan  for breakthrough nausea if needed  Chest pain - resolved Have ruled out ACS ASA 81 mg daily   COPD (chronic obstructive pulmonary disease) Stable as needed albuterol  and Mucinex    Chronic back pain Follow w/ PCP Flexeril  for muscle spasm   Tobacco abuse and  Cocaine abuse Nicotine  patch Counseling about importance of quitting substance use    Class 3 obesity based on BMI: Body mass index is 44.08 kg/m.  Underweight - under 18  overweight - 25 to 29 obese - 30 or more Class 1 obesity: BMI of 30.0 to 34 Class 2 obesity: BMI of 35.0 to 39 Class 3 obesity: BMI of 40.0 to 49 Super Morbid Obesity: BMI 50-59 Super-super Morbid Obesity: BMI 60+ Significantly low or high BMI is associated with higher medical risk.  Weight management advised as adjunct to other disease  management and risk reduction treatments            Discharge Instructions  Allergies as of 08/26/2023       Reactions   Chlorhexidine  Gluconate Itching   Patient experienced uncontrollable itching after using the chg wipes prior to surgery. Was relieved after wiping away the chg with aloe wipes.         Medication List     TAKE these medications    albuterol  108 (90 Base) MCG/ACT inhaler Commonly known as: VENTOLIN  HFA Inhale 2 puffs into the lungs every 6 (six) hours as needed for wheezing or shortness of breath.   aspirin  EC 81 MG tablet Take 1 tablet (81 mg total) by mouth daily. Swallow whole. Start taking on: August 27, 2023   cyclobenzaprine  10 MG tablet Commonly known as: FLEXERIL  Take 1 tablet (10 mg total) by mouth 3 (three) times daily as needed for muscle spasms.   FreeStyle [employer] Use as directed with continuous glucose monitor   insulin  aspart 100 UNIT/ML FlexPen Commonly known as: NOVOLOG  Inject 0-17 Units into the skin 3 (three) times daily with meals. CBG < 70: NO insulin ; CBG 70 - 120: 5 units; CBG 121 - 150: 7 unit; CBG 151 - 200: 9 units; CBG 201 - 250: 11 units; CBG 251 - 300: 13 units; CBG 301 - 350: 15 units; CBG 351 - 400: 17 units   insulin  glargine-yfgn 100 UNIT/ML Pen Commonly known as: SEMGLEE  Inject 40 Units into the skin daily.   insulin  starter kit- pen needles Misc 1 kit by Other route once for 1 dose.   nicotine  21 mg/24hr patch Commonly known as: NICODERM CQ  - dosed in mg/24 hours Place 1 patch (21 mg total) onto the skin daily. Start taking on: August 27, 2023   pantoprazole  40 MG tablet Commonly known as: PROTONIX  Take 1 tablet (40 mg total) by mouth daily at 12 noon. Start taking on: August 27, 2023   promethazine  12.5 MG tablet Commonly known as: PHENERGAN  Take 1 tablet (12.5 mg total) by mouth every 6 (six) hours as needed for nausea or vomiting.         Follow-up Information     Bluford Burkitt, NP.  Schedule an appointment as soon as possible for a visit.   Specialty: Nurse Practitioner Why: Hospital follow up ASAP for DKA / new insulin  dependence diabetes Contact information: [address] [place] [address] [phone]                 Allergies  Allergen Reactions   Chlorhexidine  Gluconate Itching    Patient experienced uncontrollable itching after using the chg wipes prior to surgery. Was relieved after wiping away the chg with aloe wipes.      Subjective: pt eating well this morning, no nausea, no CP/SOB, ambulating well, no concerns for discharge    Discharge Exam: BP (!) 89/60 (BP Location: Left  Arm)   Pulse 90   Temp 98.6 F (37 C) (Oral)   Resp 19   Ht 5\' 8"  (1.727 m)   Wt 131.5 kg   SpO2 95%   BMI 44.08 kg/m  General: Pt is alert, awake, not in acute distress Cardiovascular: RRR, S1/S2 +, no rubs, no gallops Respiratory: CTA bilaterally, no wheezing, no rhonchi Abdominal: Soft, NT, ND, bowel sounds + Extremities: no edema, no cyanosis     The results of significant diagnostics from this hospitalization (including imaging, microbiology, ancillary and laboratory) are listed below for reference.     Microbiology: Recent Results (from the past 240 hours)  MRSA Next Gen by PCR, Nasal     Status: None   Collection Time: 08/22/23  1:14 AM   Specimen: Nasal Mucosa; Nasal Swab  Result Value Ref Range Status   MRSA by PCR Next Gen NOT DETECTED NOT DETECTED Final    Comment: (NOTE) The GeneXpert MRSA Assay (FDA approved for NASAL specimens only), is one component of a comprehensive MRSA colonization surveillance program. It is not intended to diagnose MRSA infection nor to guide or monitor treatment for MRSA infections. Test performance is not FDA approved in patients less than 63 years old. Performed at [facility], [address] [place], [place] [address]      Labs: BNP (last 3 results) No results for input(s):  "BNP" in the last 8760 hours. Basic Metabolic Panel: Recent Labs  Lab 08/22/23 2108 08/23/23 0742 08/24/23 0655 08/25/23 0554 08/26/23 0539  NA 141 140 136 137 134*  K 3.6 3.5 3.3* 3.1* 3.4*  CL 106 107 103 101 101  CO2 22 23 24 28 25   GLUCOSE 164* 163* 261* 212* 300*  BUN 19 19 19 16 14   CREATININE 1.02 1.09 1.05 0.95 0.98  CALCIUM 9.4 9.1 8.4* 8.2* 8.1*   Liver Function Tests: No results for input(s): "AST", "ALT", "ALKPHOS", "BILITOT", "PROT", "ALBUMIN" in the last 168 hours. No results for input(s): "LIPASE", "AMYLASE" in the last 168 hours. No results for input(s): "AMMONIA" in the last 168 hours. CBC: Recent Labs  Lab 08/21/23 2003  WBC 10.2  HGB 17.9*  HCT 52.2*  MCV 87.0  PLT 228   Cardiac Enzymes: No results for input(s): "CKTOTAL", "CKMB", "CKMBINDEX", "TROPONINI" in the last 168 hours. BNP: Invalid input(s): "POCBNP" CBG: Recent Labs  Lab 08/25/23 1953 08/25/23 2245 08/26/23 0422 08/26/23 0814 08/26/23 1141  GLUCAP 282* 274* 284* 328* 259*   D-Dimer No results for input(s): "DDIMER" in the last 72 hours. Hgb A1c No results for input(s): "HGBA1C" in the last 72 hours. Lipid Profile No results for input(s): "CHOL", "HDL", "LDLCALC", "TRIG", "CHOLHDL", "LDLDIRECT" in the last 72 hours. Thyroid function studies No results for input(s): "TSH", "T4TOTAL", "T3FREE", "THYROIDAB" in the last 72 hours.  Invalid input(s): "FREET3" Anemia work up No results for input(s): "VITAMINB12", "FOLATE", "FERRITIN", "TIBC", "IRON", "RETICCTPCT" in the last 72 hours. Urinalysis    Component Value Date/Time   COLORURINE STRAW (A) 08/22/2023 0014   APPEARANCEUR CLEAR (A) 08/22/2023 0014   LABSPEC 1.028 08/22/2023 0014   PHURINE 5.0 08/22/2023 0014   GLUCOSEU >=500 (A) 08/22/2023 0014   GLUCOSEU NEGATIVE 01/15/2023 1320   HGBUR SMALL (A) 08/22/2023 0014   BILIRUBINUR NEGATIVE 08/22/2023 0014   KETONESUR 80 (A) 08/22/2023 0014   PROTEINUR 30 (A) 08/22/2023 0014    UROBILINOGEN 4.0 (A) 01/15/2023 1320   NITRITE NEGATIVE 08/22/2023 0014   LEUKOCYTESUR NEGATIVE 08/22/2023 0014   Sepsis Labs  Recent Labs  Lab 08/21/23 2003  WBC 10.2   Microbiology Recent Results (from the past 240 hours)  MRSA Next Gen by PCR, Nasal     Status: None   Collection Time: 08/22/23  1:14 AM   Specimen: Nasal Mucosa; Nasal Swab  Result Value Ref Range Status   MRSA by PCR Next Gen NOT DETECTED NOT DETECTED Final    Comment: (NOTE) The GeneXpert MRSA Assay (FDA approved for NASAL specimens only), is one component of a comprehensive MRSA colonization surveillance program. It is not intended to diagnose MRSA infection nor to guide or monitor treatment for MRSA infections. Test performance is not FDA approved in patients less than 62 years old. Performed at [facility], [address] [place], [place] [address]    Imaging DG Chest 2 View Result Date: 08/21/2023 CLINICAL DATA:  Chest pain EXAM: CHEST - 2 VIEW COMPARISON:  None Available. FINDINGS: The heart size and mediastinal contours are within normal limits. Both lungs are clear. The visualized skeletal structures are unremarkable. IMPRESSION: No active cardiopulmonary disease. Electronically Signed   By: Worthy Heads M.D.   On: 08/21/2023 21:22      Time coordinating discharge: over 30 minutes  SIGNED:  Geniece Akers DO [facility]

## 2023-08-26 NOTE — Inpatient Diabetes Management (Addendum)
 Inpatient Diabetes Program Recommendations  AACE/ADA: New Consensus Statement on Inpatient Glycemic Control  Target Ranges:  Prepandial:   less than 140 mg/dL      Peak postprandial:   less than 180 mg/dL (1-2 hours)      Critically ill patients:  140 - 180 mg/dL    Latest Reference Range & Units 08/25/23 07:27 08/25/23 11:28 08/25/23 15:55 08/25/23 19:53 08/25/23 22:45 08/26/23 04:22  Glucose-Capillary 70 - 99 mg/dL 782 (H) 956 (H) 213 (H) 282 (H) 274 (H) 284 (H)   Review of Glycemic Control  Diabetes history: New DM dx this admission Outpatient Diabetes medications: NA Current orders for Inpatient glycemic control: Semglee  28 units daily, Novolog  0-9 units Q4H   Inpatient Diabetes Program Recommendations:     Insulin : Please consider increasing Semglee  to 40 units daily and adding  Novolog  4 units TID with meals for meal coverage if patient eats at least 50% of meals.   Discharge Recommendations: Other recommendations: Freestyle Libre 3 Glucose sensor: [id number]  Freestyle Libre 3 Reader: [phone] Long acting recommendations: Insulin  Glargine (LANTUS ) Solostar Pen Dose TBD by MD at time of discharge  Short acting recommendations:  Meal coverage ONLY Insulin  aspart (NOVOLOG ) FlexPen  Dose TBD by MD at time of discharge   Supply/Referral recommendations: Glucometer Test strips Lancet device Lancets Pen needles - standard   Use Adult Diabetes Insulin  Treatment Post Discharge order set.  Addendum 08/26/23@14 :50-Talked with patient at bedside to review DM, insulin , and FreeStyle Libre 3 CGM. Discussed that insurance covers Lantus  and Novolog  insulin ; discussed both insulin  in detail and how it works and typically taken. Discussed insulin  storage, injection sites, and importance of rotating injection sites.  Stressed to patient to pay attention to which insulin  he is using before he takes it to make sure he does not get the mixed up. Reviewed how to use a correction scale with the Novolog   insulin . Patient was eager to practice how to use the insulin  pen. Patient was able to demonstrate how to use the insulin  pen; had to remind him about doing the 2 unit prime and holding the insulin  pen in place for 10 seconds after he injects the insulin .  Patient states he feels much more comfortable with the pen now. Reviewed how to apply the FreeStyle Libre 3 and how to connect the sensor to the reader device. Reminded patient he was given FreeStyle Libre 3 sensor samples but he will need to [employer] 3 reader device so he can get started on the CGM. Had patient watch YouTube video on how to apply FreeStyle Libre 3 CGM sensor. Encouraged patient to use data from the CGM to help figure out how his glucose responds to certain foods and activity. Patient reports he has an appointment with PCP on 09/02/23. Asked that he be sure to let PCP know he has the CGM so provider can review the information and use the data to adjust insulin  if needed. Patient verbalized understanding of information and states he has no questions at this time.   Thanks, Beacher Limerick, RN, MSN, CDCES Diabetes Coordinator Inpatient Diabetes Program [phone] (Team Pager from 8am to 5pm)

## 2023-08-26 NOTE — Telephone Encounter (Signed)
 Pharmacy Patient Advocate Encounter  Received notification from [facility] Medicaid that Prior Authorization for FreeStyle Libre 3 Reader device  has been APPROVED from 08/12/2023 to 02/22/2024   PA #/Case ID/Reference #: [id number]

## 2023-08-26 NOTE — TOC Transition Note (Signed)
 Transition of Care [facility]) - Discharge Note   Patient Details  Name: Barry Raymond MRN: [id number] Date of Birth: [birth year]  Transition [facility]) CM/SW Contact:  Elsie Halo, RN Phone Number: 08/26/2023, 4:12 PM   Clinical Narrative:    Patient is medically clear for dc to home. TOC placed resources on the AVS. No other TOC needs identified.   Final next level of care: Home/Self Care Barriers to Discharge: Continued Medical Work up   Patient Goals and CMS Choice            Discharge Placement                       Discharge Plan and Services Additional resources added to the After Visit Summary for     Discharge Planning Services: CM Consult                                 Social Drivers of Health (SDOH) Interventions SDOH Screenings   Food Insecurity: Food Insecurity Present (08/22/2023)  Housing: High Risk (08/22/2023)  Transportation Needs: Unmet Transportation Needs (08/22/2023)  Utilities: At Risk (08/22/2023)  Depression (PHQ2-9): Low Risk  (05/22/2023)  Social Connections: Socially Isolated (08/22/2023)  Tobacco Use: High Risk (08/21/2023)     Readmission Risk Interventions     No data to display

## 2023-08-26 NOTE — Telephone Encounter (Signed)
 Pharmacy Patient Advocate Encounter   Received notification from Inpatient Request that prior authorization for FreeStyle Libre 3 Reader device is required/requested.   Insurance verification completed.   The patient is insured through [facility] [place] .   Per test claim: PA required; PA submitted to above mentioned insurance via CoverMyMeds Key/confirmation #/EOC [id number] Status is pending

## 2023-08-27 ENCOUNTER — Ambulatory Visit: Attending: Orthopedic Surgery

## 2023-08-27 ENCOUNTER — Encounter

## 2023-08-27 ENCOUNTER — Telehealth: Payer: Self-pay

## 2023-08-27 NOTE — Transitions of Care (Post Inpatient/ED Visit) (Signed)
   08/27/2023  Name: Lincoln Ginley MRN: [id number] DOB: 1965/07/22  Today's TOC FU Call Status: Today's TOC FU Call Status:: Unsuccessful Call (1st Attempt) Unsuccessful Call (1st Attempt) Date: 08/27/23  Attempted to reach the patient regarding the most recent Inpatient/ED visit.  Follow Up Plan: Additional outreach attempts will be made to reach the patient to complete the Transitions of Care (Post Inpatient/ED visit) call.   Gareld June, BSN, RN [facility]  VBCI - [employer] [facility] RN Care Manager [phone]

## 2023-08-28 ENCOUNTER — Telehealth: Payer: Self-pay

## 2023-08-28 ENCOUNTER — Encounter: Payer: Self-pay | Admitting: Nurse Practitioner

## 2023-08-28 ENCOUNTER — Ambulatory Visit (INDEPENDENT_AMBULATORY_CARE_PROVIDER_SITE_OTHER): Admitting: Nurse Practitioner

## 2023-08-28 VITALS — BP 120/82 | HR 86 | Temp 98.6°F | Ht 68.0 in | Wt 314.0 lb

## 2023-08-28 DIAGNOSIS — Z720 Tobacco use: Secondary | ICD-10-CM | POA: Diagnosis not present

## 2023-08-28 DIAGNOSIS — M545 Low back pain, unspecified: Secondary | ICD-10-CM | POA: Diagnosis not present

## 2023-08-28 DIAGNOSIS — G8929 Other chronic pain: Secondary | ICD-10-CM | POA: Diagnosis not present

## 2023-08-28 DIAGNOSIS — Z794 Long term (current) use of insulin: Secondary | ICD-10-CM | POA: Diagnosis not present

## 2023-08-28 DIAGNOSIS — E119 Type 2 diabetes mellitus without complications: Secondary | ICD-10-CM

## 2023-08-28 DIAGNOSIS — Z6841 Body Mass Index (BMI) 40.0 and over, adult: Secondary | ICD-10-CM

## 2023-08-28 NOTE — Patient Instructions (Signed)
 Visit Information  Thank you for taking time to visit with me today. Please don't hesitate to contact me if I can be of assistance to you before our next scheduled telephone appointment.  Our next appointment is by telephone on May 1st at 3:15pm  Following is a copy of your care plan:   Goals Addressed             This Visit's Progress    VBCI Transitions of Care (TOC) Care Plan       Problems:  Recent Hospitalization for treatment of DMII Diet/Nutrition/Food Resources The patient currently receives food benefits but states he used the money to buy food that is now not on his diet  and Medication management barrier this is a new diagnosis for him and he is trying to understand the new medication  Goal:  Over the next 30 days, the patient will not experience hospital readmission  Interventions:   Diabetes Interventions: Assessed patient's understanding of A1c goal: <8% Provided education to patient about basic DM disease process Reviewed medications with patient and discussed importance of medication adherence Counseled on importance of regular laboratory monitoring as prescribed Discussed plans with patient for ongoing care management follow up and provided patient with direct contact information for care management team Screening for signs and symptoms of depression related to chronic disease state  Assessed social determinant of health barriers Lab Results  Component Value Date   HGBA1C 12.4 (H) 08/21/2023    Patient Self Care Activities:  Attend all scheduled provider appointments Call pharmacy for medication refills 3-7 days in advance of running out of medications Call provider office for new concerns or questions  Notify RN Care Manager of TOC call rescheduling needs Participate in Transition of Care Program/Attend TOC scheduled calls Take medications as prescribed   check feet daily for cuts, sores or redness take the blood sugar log to all doctor visits set goal  weight fill half of plate with vegetables limit fast food meals to no more than 1 per week manage portion size prepare main meal at home 3 to 5 days each week set a realistic goal  Plan:  Telephone follow up appointment with care management team member scheduled for:  Thursday May 1 at 3:15pm        The patient has been provided with contact information for the care management team and has been advised to call with any health related questions or concerns.   Please call the care guide team at [phone] if you need to cancel or reschedule your appointment.   Please call the Suicide and Crisis Lifeline: 988 call the [place]  National Suicide Prevention Lifeline: 928 810 4795 or TTY: (434)467-0036 TTY (270) 470-1934) to talk to a trained counselor if you are experiencing a Mental Health or Behavioral Health Crisis or need someone to talk to.  Gareld June, BSN, RN [place]  VBCI - [employer] [facility] RN Care Manager [phone]

## 2023-08-28 NOTE — Progress Notes (Signed)
 Barry Burkitt, NP-C Phone: [phone]  Barry Raymond is a 58 y.o. male who presents today for hospital follow up.   Discussed the use of AI scribe software for clinical note transcription with the patient, who gave verbal consent to proceed.  History of Present Illness   Barry Raymond is a 58 year old male with diabetes who presents for a hospital follow-up.  He was recently diagnosed with diabetes following a hospitalization for diabetic ketoacidosis (DKA), during which his A1c was 12.4. He was started on insulin  therapy at that time. Since discharge, he has been feeling lightheaded and is concerned about the accuracy of his blood glucose monitoring device, a Freestyle sensor, which recently read 255 mg/dL. He has not been consistently taking his prescribed insulin  regimen, specifically the long-acting insulin  (Lantus ) at 40 units daily, which he has been administering 10 units of three times a day. He has not administered any Novolog  since leaving the hospital.   He is not following a specific diet plan but mentions consuming eggs and boiled chicken. He is uncertain about what foods are high in carbohydrates and seeks guidance on dietary changes.  He has a history of back pain, which limits his ability to stand for long periods. He has been out of work for a year due to this issue and is seeking disability benefits. He has been referred to physical therapy but has not completed it yet. He uses Flexeril  for pain management.      Social History   Tobacco Use  Smoking Status Some Days   Current packs/day: 0.20   Average packs/day: 0.2 packs/day for 20.0 years (4.0 ttl pk-yrs)   Types: Cigarettes  Smokeless Tobacco Never    Current Outpatient Medications on File Prior to Visit  Medication Sig Dispense Refill   albuterol  (VENTOLIN  HFA) 108 (90 Base) MCG/ACT inhaler Inhale 2 puffs into the lungs every 6 (six) hours as needed for wheezing or shortness of breath. 8 g 1   aspirin  EC 81 MG  tablet Take 1 tablet (81 mg total) by mouth daily. Swallow whole.     Continuous Glucose Receiver (FREESTYLE LIBRE READER) DEVI Use as directed with continuous glucose monitor 1 each 0   cyclobenzaprine  (FLEXERIL ) 10 MG tablet Take 1 tablet (10 mg total) by mouth 3 (three) times daily as needed for muscle spasms. 30 tablet 0   insulin  aspart (NOVOLOG ) 100 UNIT/ML FlexPen Inject 0-17 Units into the skin 3 (three) times daily with meals. CBG < 70: NO insulin ; CBG 70 - 120: 5 units; CBG 121 - 150: 7 unit; CBG 151 - 200: 9 units; CBG 201 - 250: 11 units; CBG 251 - 300: 13 units; CBG 301 - 350: 15 units; CBG 351 - 400: 17 units 15 mL 0   insulin  glargine (LANTUS ) 100 UNIT/ML Solostar Pen Inject 40 Units into the skin daily. 15 mL 0   nicotine  (NICODERM CQ  - DOSED IN MG/24 HOURS) 21 mg/24hr patch Place 1 patch (21 mg total) onto the skin daily. 28 patch 0   pantoprazole  (PROTONIX ) 40 MG tablet Take 1 tablet (40 mg total) by mouth daily at 12 noon. (Patient not taking: Reported on 08/28/2023) 30 tablet 0   promethazine  (PHENERGAN ) 25 MG tablet Take 0.5 tablets (12.5 mg total) by mouth every 6 (six) hours as needed for nausea or vomiting. (Patient not taking: Reported on 08/28/2023) 30 tablet 0   No current facility-administered medications on file prior to visit.     ROS see history of  present illness  Objective  Physical Exam Vitals:   08/28/23 1057  BP: 120/82  Pulse: 86  Temp: 98.6 F (37 C)  SpO2: 97%    BP Readings from Last 3 Encounters:  08/28/23 120/82  08/26/23 (!) 89/60  07/03/23 (!) 163/119   Wt Readings from Last 3 Encounters:  08/28/23 (!) 314 lb (142.4 kg)  08/28/23 (!) 314 lb (142.4 kg)  08/22/23 289 lb 14.5 oz (131.5 kg)    Physical Exam Constitutional:      General: He is not in acute distress.    Appearance: Normal appearance.  HENT:     Head: Normocephalic.  Cardiovascular:     Rate and Rhythm: Normal rate and regular rhythm.     Heart sounds: Normal heart  sounds.  Pulmonary:     Effort: Pulmonary effort is normal.     Breath sounds: Normal breath sounds.  Skin:    General: Skin is warm and dry.  Neurological:     General: No focal deficit present.     Mental Status: He is alert.  Psychiatric:        Mood and Affect: Mood normal.        Behavior: Behavior normal.      Assessment/Plan: Please see individual problem list.  New onset type 2 diabetes mellitus (HCC) Assessment & Plan: Non-compliance with insulin  regimen is causing poor glucose control. He received extensive education today on insulin  use and dietary changes to prevent complications. Start Lantus  40 units subcutaneously once daily and NovoLog  per sliding scale with meals three times daily. He was advised on a low carbohydrate and low sugar diet, avoiding high-carb foods, and encouraged to consume baked or boiled meats, vegetables, and low-sugar fruits. A dietary guidelines printout was provided. Arrange a follow-up in three months to recheck A1c and set up a phone call follow-up with a pharmacist for insulin  management and glucose monitoring.  Orders: -     AMB Referral VBCI Care Management  Chronic midline low back pain without sciatica Assessment & Plan: Chronic pain is affecting daily activities. Weight loss was discussed as potential pain alleviator. Continue physical therapy and Flexeril  as needed. Complete a functional capacity evaluation for disability.   Tobacco abuse Assessment & Plan: He is using nicotine  patches and smoking one cigarette per week. Continued use of patches for cessation is encouraged.   Morbid obesity with BMI of 45.0-49.9, adult [facility]) Assessment & Plan: New onset type 2 diabetes. Encourage healthy diet and exercise as tolerated.      Return in about 3 months (around 11/27/2023) for Follow up.   Barry Burkitt, NP-C [facility]

## 2023-08-28 NOTE — Transitions of Care (Post Inpatient/ED Visit) (Signed)
 Transition of Care  Initial  Visit Note  08/28/2023  Name: Barry Raymond MRN: [id number]          DOB: [birth year]  Situation: Patient enrolled in [facility] 30-day program. Visit completed with Billee Buddle by telephone.   Background:   Initial Transition Care Management Follow-up Telephone Call    Past Medical History:  Diagnosis Date   Arthritis    COPD (chronic obstructive pulmonary disease) (HCC)    GERD (gastroesophageal reflux disease)    OCC   Lipoma of back    Noted on xray today   Morbid obesity (HCC)    Sleep apnea    NO CPAP   Umbilical hernia     Assessment: TOC Outreach completed today. The patient is a new diabetic. He states he hadn't been feeling good for sometime and went to the ED and was told he was in DKA. The patient now with a Continuous Glucose Monitor. He has a device in his pocket that provider his sugar reading. Most recent is 130. The patient is on SSI and states he has a chart of when to give himself insulin . Education was provided regarding food choices, exercise, signs of changing blood sugar and mechanism of how insulin  works in [employer]. The patient is currently 314lbs. Discussed weight loss will improve his diabetes. Instructed the patient to check his feet. Educated the patient on long term effects of non-compliance on the body including HD and amputations. The patient states he will try to start with some exercising. Encouraged the patient to start with slow milestones and increase stamina. The patient enrolled in the 88 Day program for more education and support.  Patient Reported Symptoms: Cognitive Cognitive Status: Alert and oriented to person, place, and time      Neurological Neurological Review of Symptoms: No symptoms reported    HEENT HEENT Symptoms Reported: No symptoms reported      Cardiovascular Cardiovascular Symptoms Reported: No symptoms reported Does patient have uncontrolled Hypertension?: No Weight: (!) 314 lb (142.4 kg)   Respiratory Respiratory Symptoms Reported: No symptoms reported    Endocrine Patient reports the following symptoms related to hypoglycemia or hyperglycemia : Weakness or fatigue Is patient diabetic?: Yes Is patient checking blood sugars at home?: Yes Endocrine Conditions: Diabetes Endocrine Management Strategies: Weight management, Medication therapy, Diet modification, Exercise Endocrine Self-Management Outcome: 3 (uncertain) Endocrine Comment: The patient is a newly diagnosed diabetic this hospital admission  Gastrointestinal Gastrointestinal Symptoms Reported: No symptoms reported   Nutrition Risk Screen (CP): No indicators present  Genitourinary Genitourinary Symptoms Reported: No symptoms reported    Integumentary Integumentary Symptoms Reported: No symptoms reported    Musculoskeletal Musculoskelatal Symptoms Reviewed: Muscle pain Additional Musculoskeletal Details: Has a history of back pain Musculoskeletal Conditions: Back pain Musculoskeletal Management Strategies: Medication therapy Musculoskeletal Self-Management Outcome: 3 (uncertain) Musculoskeletal Comment: The patient is on disability Falls in the past year?: No Number of falls in past year: 1 or less Was there an injury with Fall?: No Fall Risk Category Calculator: 0 Patient Fall Risk Level: Low Fall Risk Patient at Risk for Falls Due to: No Fall Risks  Psychosocial Psychosocial Symptoms Reported: No symptoms reported         There were no vitals filed for this visit.  Medications Reviewed Today     Reviewed by Claudene Crystal, RN (Case Manager) on 08/28/23 at 1516  Med List Status: <None>   Medication Order Taking? Sig Documenting Provider Last Dose Status Informant  albuterol  (VENTOLIN  HFA) 108 (90  Base) MCG/ACT inhaler [id number] No Inhale 2 puffs into the lungs every 6 (six) hours as needed for wheezing or shortness of breath. Tona Francis, NP Past Month Active Self, Pharmacy Records  aspirin  EC  81 MG tablet [id number]  Take 1 tablet (81 mg total) by mouth daily. Swallow whole. Melodi Sprung, DO  Active   Continuous Glucose Receiver (FREESTYLE LIBRE READER) [place] [id number]  Use as directed with continuous glucose monitor Alexander, Natalie, DO  Active   cyclobenzaprine  (FLEXERIL ) 10 MG tablet [id number]  Take 1 tablet (10 mg total) by mouth 3 (three) times daily as needed for muscle spasms. Alexander, Natalie, DO  Active   insulin  aspart (NOVOLOG ) 100 UNIT/ML FlexPen [id number]  Inject 0-17 Units into the skin 3 (three) times daily with meals. CBG < 70: NO insulin ; CBG 70 - 120: 5 units; CBG 121 - 150: 7 unit; CBG 151 - 200: 9 units; CBG 201 - 250: 11 units; CBG 251 - 300: 13 units; CBG 301 - 350: 15 units; CBG 351 - 400: 17 units Melodi Sprung, DO  Active   insulin  glargine (LANTUS ) 100 UNIT/ML Solostar Pen [id number]  Inject 40 Units into the skin daily. Alexander, Natalie, DO  Active   nicotine  (NICODERM CQ  - DOSED IN MG/24 HOURS) 21 mg/24hr patch [id number]  Place 1 patch (21 mg total) onto the skin daily. Alexander, Natalie, DO  Active   pantoprazole  (PROTONIX ) 40 MG tablet [id number] No Take 1 tablet (40 mg total) by mouth daily at 12 noon.  Patient not taking: Reported on 08/28/2023   Alexander, Natalie, DO Not Taking Active   promethazine  (PHENERGAN ) 25 MG tablet [id number] No Take 0.5 tablets (12.5 mg total) by mouth every 6 (six) hours as needed for nausea or vomiting.  Patient not taking: Reported on 08/28/2023   Alexander, Natalie, DO Not Taking Active   Med List Note Ronnald Coil, RN 10/29/16 1417): PT CURRENTLY TAKES NO MEDICATIONS PER PT (10-29-16)            Recommendation:   Food Resources due to limited income . The patient states he bought his food for the month but it was the wrong choices Exercise 20 minutes daily Monitor blood glucose. Report BG over 300 to PCP Record blood sugars in a log book Rotate injection site of insulin  Check feet daily. Report new  or non-healing injury  Follow Up Plan:   Telephone follow-up in 1 week  Gareld June, BSN, RN [place]  VBCI - [facility] RN Care Manager [phone]

## 2023-09-01 ENCOUNTER — Encounter: Payer: Self-pay | Admitting: Nurse Practitioner

## 2023-09-01 NOTE — Assessment & Plan Note (Addendum)
 Chronic pain is affecting daily activities. Weight loss was discussed as potential pain alleviator. Continue physical therapy and Flexeril  as needed. Complete a functional capacity evaluation for disability.

## 2023-09-01 NOTE — Assessment & Plan Note (Signed)
 He is using nicotine  patches and smoking one cigarette per week. Continued use of patches for cessation is encouraged.

## 2023-09-01 NOTE — Assessment & Plan Note (Signed)
 New onset type 2 diabetes. Encourage healthy diet and exercise as tolerated.

## 2023-09-01 NOTE — Assessment & Plan Note (Addendum)
 Non-compliance with insulin  regimen is causing poor glucose control. He received extensive education today on insulin  use and dietary changes to prevent complications. Start Lantus  40 units subcutaneously once daily and NovoLog  per sliding scale with meals three times daily. He was advised on a low carbohydrate and low sugar diet, avoiding high-carb foods, and encouraged to consume baked or boiled meats, vegetables, and low-sugar fruits. A dietary guidelines printout was provided. Arrange a follow-up in three months to recheck A1c and set up a phone call follow-up with a pharmacist for insulin  management and glucose monitoring.

## 2023-09-02 ENCOUNTER — Ambulatory Visit: Admitting: Nurse Practitioner

## 2023-09-02 ENCOUNTER — Ambulatory Visit: Payer: Self-pay

## 2023-09-02 ENCOUNTER — Other Ambulatory Visit: Payer: Self-pay | Admitting: Nurse Practitioner

## 2023-09-02 ENCOUNTER — Encounter

## 2023-09-02 ENCOUNTER — Telehealth: Payer: Self-pay

## 2023-09-02 DIAGNOSIS — E119 Type 2 diabetes mellitus without complications: Secondary | ICD-10-CM

## 2023-09-02 MED ORDER — FREESTYLE LIBRE 3 SENSOR MISC: 11 refills | Status: DC

## 2023-09-02 NOTE — Progress Notes (Signed)
 Care Guide Pharmacy Note  09/02/2023 Name: Barry Raymond MRN: [id number] DOB: 1966-04-16  Referred By: Bluford Burkitt, NP Reason for referral: Complex Care Management (Outreach to schedule with Pharm d )   Barry Raymond is a 58 y.o. year old male who is a primary care patient of Bluford Burkitt, NP.  Barry Raymond was referred to the pharmacist for assistance related to: DMII  Successful contact was made with the patient to discuss pharmacy services including being ready for the pharmacist to call at least 5 minutes before the scheduled appointment time and to have medication bottles and any blood pressure readings ready for review. The patient agreed to meet with the pharmacist via telephone visit on (date/time).09/08/2023  Barry Raymond , RMA     [place]  [facility] Guide  Direct Dial: [phone]  Website: [email or web address]

## 2023-09-02 NOTE — Telephone Encounter (Signed)
 Patient stated that he has Continuous Glucose Receiver (FREESTYLE LIBRE READER) DEVI [[id number]] but he does not have any sensors. He is requesting prescription. Also asked if he should continue to take his insulin ?  Copied from CRM [id number]. Topic: Clinical - Prescription Issue >> Sep 02, 2023  9:00 AM Freya Jesus wrote: Reason for CRM: Patient stated that he has Continuous Glucose Receiver (FREESTYLE LIBRE READER) DEVI [[id number]] but he does not have any sensors. He is requesting prescription. Also asked if he should continue to take his insulin ?

## 2023-09-03 ENCOUNTER — Telehealth: Payer: Self-pay

## 2023-09-03 ENCOUNTER — Other Ambulatory Visit (HOSPITAL_COMMUNITY): Payer: Self-pay

## 2023-09-03 MED ORDER — FREESTYLE LIBRE 3 PLUS SENSOR MISC: 11 refills | Status: DC

## 2023-09-03 NOTE — Telephone Encounter (Signed)
 PA needed for  FREESTYLE LIBRE 3 SENSOR)

## 2023-09-03 NOTE — Addendum Note (Signed)
 Addended by: Jackline Maser on: 09/03/2023 10:14 AM   Modules accepted: Orders

## 2023-09-03 NOTE — Telephone Encounter (Signed)
 Message sent to prior auth team to start a  prior auth

## 2023-09-03 NOTE — Telephone Encounter (Signed)
 PA request for Freestyle Libre 3 Plus Sensor has been Approved. New Encounter has been or will be created for follow up. For additional info see Pharmacy Prior Auth telephone encounter from 08/22/23.

## 2023-09-03 NOTE — Telephone Encounter (Signed)
 Copied from CRM [id number]. Topic: Clinical - Prescription Issue >> Sep 03, 2023  8:20 AM Adaysia C wrote: Reason for CRM: Patient attempted to pick up prescription for Continuous Glucose Sensor (FREESTYLE LIBRE 3 SENSOR) MISC from pharmacy; the pharmacy rep said he can not get the prescription yet because it is still being discussed between the provider and patients insurance; patient has requested a call back for update on this prescription

## 2023-09-04 ENCOUNTER — Telehealth: Payer: Self-pay

## 2023-09-05 ENCOUNTER — Other Ambulatory Visit (HOSPITAL_COMMUNITY): Payer: Self-pay

## 2023-09-05 ENCOUNTER — Telehealth: Payer: Self-pay

## 2023-09-05 NOTE — Telephone Encounter (Signed)
 Pharmacy Patient Advocate Encounter   Received notification from CoverMyMeds that prior authorization for FREESTYLE LIBRE PLUS SENSOR is required/requested.   Insurance verification completed.   The patient is insured through [facility] .   Per test claim: PA required; PA submitted to above mentioned insurance via CoverMyMeds Key/confirmation #/EOC [facility] Status is pending

## 2023-09-08 ENCOUNTER — Ambulatory Visit

## 2023-09-08 ENCOUNTER — Telehealth: Payer: Self-pay

## 2023-09-08 ENCOUNTER — Encounter: Payer: Self-pay | Admitting: Emergency Medicine

## 2023-09-08 ENCOUNTER — Ambulatory Visit: Payer: Self-pay

## 2023-09-08 ENCOUNTER — Other Ambulatory Visit

## 2023-09-08 ENCOUNTER — Emergency Department
Admission: EM | Admit: 2023-09-08 | Discharge: 2023-09-08 | Disposition: A | Discharge status: Home / Self Care | Attending: Emergency Medicine | Admitting: Emergency Medicine

## 2023-09-08 ENCOUNTER — Encounter: Payer: Self-pay | Admitting: Pharmacist

## 2023-09-08 ENCOUNTER — Other Ambulatory Visit: Payer: Self-pay

## 2023-09-08 DIAGNOSIS — E11649 Type 2 diabetes mellitus with hypoglycemia without coma: Secondary | ICD-10-CM | POA: Insufficient documentation

## 2023-09-08 DIAGNOSIS — E162 Hypoglycemia, unspecified: Secondary | ICD-10-CM

## 2023-09-08 DIAGNOSIS — T383X1A Poisoning by insulin and oral hypoglycemic [antidiabetic] drugs, accidental (unintentional), initial encounter: Secondary | ICD-10-CM | POA: Diagnosis not present

## 2023-09-08 LAB — CBG MONITORING, ED
Glucose-Capillary: 100 mg/dL — ABNORMAL HIGH (ref 70–99)
Glucose-Capillary: 57 mg/dL — ABNORMAL LOW (ref 70–99)
Glucose-Capillary: 168 mg/dL — ABNORMAL HIGH (ref 70–99)

## 2023-09-08 LAB — BASIC METABOLIC PANEL WITH GFR
Anion gap: 7 (ref 5–15)
BUN: 20 mg/dL (ref 6–20)
CO2: 26 mmol/L (ref 22–32)
Calcium: 8.7 mg/dL — ABNORMAL LOW (ref 8.9–10.3)
Chloride: 109 mmol/L (ref 98–111)
Creatinine, Ser: 0.95 mg/dL (ref 0.61–1.24)
GFR, Estimated: >60 mL/min (ref >60)
Glucose, Bld: 106 mg/dL — ABNORMAL HIGH (ref 70–99)
Potassium: 3.9 mmol/L (ref 3.5–5.1)
Sodium: 142 mmol/L (ref 135–145)

## 2023-09-08 MED ORDER — DEXTROSE IN LACTATED RINGERS 5 % IV SOLN: INTRAVENOUS | Status: DC

## 2023-09-08 MED ORDER — DEXTROSE IN LACTATED RINGERS 5 % IV SOLN
INTRAVENOUS | Status: DC
  Filled 2023-09-08: qty 1000

## 2023-09-08 NOTE — ED Notes (Signed)
 8oz Orange Juice provided to patient at this time.  Libre Sensor currently reading 63.

## 2023-09-08 NOTE — Progress Notes (Unsigned)
 No show

## 2023-09-08 NOTE — ED Notes (Signed)
 Pt finished 1 cup of orange juice and is ambulatory without assistance. MD updated.

## 2023-09-08 NOTE — Telephone Encounter (Signed)
 Copied from CRM [id number]. Topic: Clinical - Medication Question >> Sep 08, 2023 10:37 AM Barry Raymond wrote: Reason for CRM: PT STATED HE TOOK TO MUCH INSULIN  THIS MORNING AND JUST WANTED TO LET SOMEONE KNOW,. STATED HE FEELS FINE AND NOT FEELING BAD.

## 2023-09-08 NOTE — ED Provider Notes (Signed)
 [facility] Provider Note    Event Date/Time   First MD Initiated Contact with Patient 09/08/23 1155     (approximate)   History   Hypoglycemia   HPI  Barry Raymond is a 58 y.o. male who presents to the ED for evaluation of Hypoglycemia   Review/22 medical DC summary were patient was admitted for new onset diabetes and DKA.  Discharged on 40 units of insulin  glargine, 0-17 units of mealtime.   Patient presents to the ED with hypoglycemia after accidentally giving himself his short acting insulin , NovoLog , 40 units this morning instead of his glargine.  This occurred around 9 AM.  Has a CGM, glucoses dropped into the 40s-50s and presents to the ED with symptomatic hypoglycemia.  No syncope, falls or injuries.  Reports it was a "stupid mistake" and accidental without intent of self-harm   Physical Exam   Triage Vital Signs: ED Triage Vitals [09/08/23 1146]  Encounter Vitals Group     BP (!) 140/68     Systolic BP Percentile      Diastolic BP Percentile      Pulse Rate 92     Resp 15     Temp      Temp Source Oral     SpO2 100 %     Weight 300 lb (136.1 kg)     Height 5\' 8"  (1.727 m)     Head Circumference      Peak Flow      Pain Score 0     Pain Loc      Pain Education      Exclude from Growth Chart     Most recent vital signs: Vitals:   09/08/23 1146 09/08/23 1321  BP: (!) 140/68 120/78  Pulse: 92 90  Resp: 15 18  Temp:  97.6 F (36.4 C)  SpO2: 100% 100%    General: Awake, no distress.  CV:  Good peripheral perfusion.  Resp:  Normal effort.  Abd:  No distention.  MSK:  No deformity noted.  Neuro:  No focal deficits appreciated. Other:     ED Results / Procedures / Treatments   Labs (all labs ordered are listed, but only abnormal results are displayed) Labs Reviewed  BASIC METABOLIC PANEL WITH GFR - Abnormal; Notable for the following components:      Result Value   Glucose, Bld 106 (*)    Calcium 8.7 (*)    All other  components within normal limits  CBG MONITORING, ED - Abnormal; Notable for the following components:   Glucose-Capillary 100 (*)    All other components within normal limits  CBG MONITORING, ED - Abnormal; Notable for the following components:   Glucose-Capillary 57 (*)    All other components within normal limits    EKG   RADIOLOGY   Official radiology report(s): No results found.  PROCEDURES and INTERVENTIONS:  .Critical Care  Performed by: Arline Bennett, MD Authorized by: Arline Bennett, MD   Critical care provider statement:    Critical care time (minutes):  30   Critical care time was exclusive of:  Separately billable procedures and treating other patients   Critical care was necessary to treat or prevent imminent or life-threatening deterioration of the following conditions:  Endocrine crisis   Critical care was time spent personally by me on the following activities:  Development of treatment plan with patient or surrogate, discussions with consultants, evaluation of patient's response to treatment, examination of patient, ordering and  review of laboratory studies, ordering and review of radiographic studies, ordering and performing treatments and interventions, pulse oximetry, re-evaluation of patient's condition and review of old charts   Medications  dextrose  5 % in lactated ringers  infusion ( Intravenous New Bag/Given 09/08/23 1451)     IMPRESSION / MDM / ASSESSMENT AND PLAN / ED COURSE  I reviewed the triage vital signs and the nursing notes.  Differential diagnosis includes, but is not limited to, suicide attempt, accidental overdose, renal dysfunction  {Patient presents with symptoms of an acute illness or injury that is potentially life-threatening.  Patient presents to the ED with an accidental overdose of his short acting NovoLog  insulin  this morning.  Some improvement of his glucose with p.o. intake of juice and other sugary products.  We will obtain an IV  and started D5 LR infusion.  Suspect he will require couple hours of support and hopefully subsequent outpatient management.  Clinical Course as of 09/08/23 1512  Mon Sep 08, 2023  1246 Reassessed doing well with oral glucose replacement. [DS]  1258 Reassessed. Looks [employer]. Glucose 120s [DS]  1358 Reassessed.  Still doing well.  Glucose 100s [DS]  1443 Reassessed, glucose dipping into the 50s despite p.o. intake.  We will add D5 LR infusion [DS]  1458 S/o: from Dr. Felipe Horton - accidental 40units novolog  (instead of lantus )  Observation period [MM]    Clinical Course User Index [DS] Arline Bennett, MD [MM] Collis Deaner, MD     FINAL CLINICAL IMPRESSION(S) / ED DIAGNOSES   Final diagnoses:  Hypoglycemia     Rx / DC Orders   ED Discharge Orders     None        Note:  This document was prepared using Dragon voice recognition software and may include unintentional dictation errors.   Arline Bennett, MD 09/08/23 [phone]

## 2023-09-08 NOTE — ED Triage Notes (Addendum)
 Pt in via POV, states his sugar is dropping, states he accidentally took 40 units of Novolog  approximately 1 hour ago.  Patient unable to tell me how much Novolog  he normally takes, states "its on a sliding scale."  Patient diaphoretic, A/Ox4 at this time.    Libre Sensor reading 53 in triage.  Placed in treatment bed at this time.

## 2023-09-08 NOTE — Telephone Encounter (Signed)
 Pharmacy Patient Advocate Encounter  Received notification from [facility] that Prior Authorization for FREESTYLE LIBRE PLUS SENSOR  has been DENIED.  Full denial letter will be uploaded to the media tab. See denial reason below.   PA #/Case ID/Reference #: [id number]

## 2023-09-08 NOTE — Progress Notes (Signed)
 Attempted to contact patient for scheduled appointment for medication management. Left HIPAA compliant message for patient to return my call at their convenience. Left direct callback number.   Daron Ellen, PharmD Clinical Pharmacist [facility], [facility] Ph: [phone]

## 2023-09-08 NOTE — Discharge Instructions (Signed)
 Your evaluation in the emergency department was notable for low blood sugar, unfortunately this has corrected.  Continue to keep a close eye on your blood sugar and take your medications exactly as prescribed.  Please do follow-up with your primary care provider for reevaluation, and return to the emergency department with any new or worsening symptoms.

## 2023-09-08 NOTE — ED Provider Notes (Signed)
 Signout from Dr. Felipe Horton.  See updated ED course below. Physical Exam  BP 135/75   Pulse 91   Temp 97.6 F (36.4 C) (Oral)   Resp 18   Ht 5\' 8"  (1.727 m)   Wt 136.1 kg   SpO2 99%   BMI 45.61 kg/m   Physical Exam  Procedures  Procedures  ED Course / MDM   Clinical Course as of 09/08/23 1824  Mon Sep 08, 2023  1246 Reassessed doing well with oral glucose replacement. [DS]  1258 Reassessed. Looks [employer]. Glucose 120s [DS]  1358 Reassessed.  Still doing well.  Glucose 100s [DS]  1443 Reassessed, glucose dipping into the 50s despite p.o. intake.  We will add D5 LR infusion [DS]  1458 S/o: from Dr. Felipe Horton - accidental 40units novolog  (instead of lantus )  Observation period [MM]  1741 Patient's glucometer reading in the 110s.  Just finished eating.  Dextrose  stopped.  Will check blood sugar in about 30 minutes.  NovoLog  peak action generally 1-3 hours, patient took medications at 9 AM.  Duration 3-5 hours.  Should not have recurrent hypoglycemia if it was the etiology of his presentation.  If no further hypoglycemia on reeval, will proceed with discharge and close PMD follow-up.  If recurrent hypoglycemia, anticipate admission. [MM]  1814 Glucose-Capillary(!): 168 [MM]  1822 Patient reevaluated, continues to feel very well.  Confirms with me this was an accidental overdose, discussed importance of following medication regimen as prescribed and the difference between NovoLog  and Lantus .  Will defer any further insulin  today and resume his regular regimen tomorrow.  Has his glucometer.  Will proceed with discharge home. [MM]    Clinical Course User Index [DS] Arline Bennett, MD [MM] Collis Deaner, MD   Medical Decision Making Amount and/or Complexity of Data Reviewed Labs: ordered. Decision-making details documented in ED Course.  Risk Prescription drug management.          Collis Deaner, MD 09/08/23 [phone]

## 2023-09-08 NOTE — Telephone Encounter (Signed)
 Copied from CRM [id number]. Topic: Appointments - Scheduling Inquiry for Clinic >> Sep 08, 2023 10:35 AM Barry Raymond wrote: Reason for CRM: PT CALLING BACK FOR HIS PHARMACIST APPT THAT WAS SCHEDULED AT 9

## 2023-09-08 NOTE — ED Notes (Signed)
 Pt given sack meal and orange juice.

## 2023-09-08 NOTE — Telephone Encounter (Signed)
 Copied from CRM [id number]. Topic: Clinical - Red Word Triage >> Sep 08, 2023 11:34 AM Allyne Areola wrote: Red Word that prompted transfer to Nurse Triage: Patient is currently in the emergency room, he took to much insulin  this morning and they have not seen the patient yet. He is in the waiting room. He is sweating and sugar levels keep dropping.  States is currently in ER.  Patient disconnected.  Attempted to call back and left message for patient to call back if he needs anything else.

## 2023-09-10 ENCOUNTER — Other Ambulatory Visit: Payer: Self-pay

## 2023-09-10 ENCOUNTER — Encounter: Payer: Self-pay | Admitting: Pharmacist

## 2023-09-10 VITALS — Wt 300.0 lb

## 2023-09-10 DIAGNOSIS — E119 Type 2 diabetes mellitus without complications: Secondary | ICD-10-CM

## 2023-09-10 NOTE — Transitions of Care (Post Inpatient/ED Visit) (Signed)
 Transition of Care week 3  Visit Note  09/10/2023  Name: Barry Raymond MRN: [id number]          DOB: 23-Dec-1965  Situation: Patient enrolled in [facility] 30-day program. Visit completed with Billee Buddle by telephone.   Background:    Past Medical History:  Diagnosis Date   Arthritis    COPD (chronic obstructive pulmonary disease) (HCC)    GERD (gastroesophageal reflux disease)    OCC   Lipoma of back    Noted on xray today   Morbid obesity (HCC)    Sleep apnea    NO CPAP   Umbilical hernia     Assessment: TOC Outreach complete. The patient went to the ED two days ago because he accidentally gave himself too much insulin . He is a new diabetic who requires more education and has a referral for Diabetic education. Reviewed food choices. The patient is on food stamps and states that he buys food for his daughter and her five children and he buys what they want. He has a CBG and his blood sugars range from 100-102. He states that his BG was 69 yesterday and he ate some candy and drank some soda. He states he is walking daily. He has lost 14 pounds in the last two weeks.  Patient Reported Symptoms: Cognitive Cognitive Status: Alert and oriented to person, place, and time      Neurological Neurological Review of Symptoms: No symptoms reported    HEENT HEENT Symptoms Reported: No symptoms reported      Cardiovascular Cardiovascular Symptoms Reported: No symptoms reported Weight: 300 lb (136.1 kg)  Respiratory Respiratory Symptoms Reported: No symptoms reported    Endocrine Patient reports the following symptoms related to hypoglycemia or hyperglycemia : No symptoms reported Is patient diabetic?: Yes Is patient checking blood sugars at home?: Yes Endocrine Conditions: Diabetes Endocrine Management Strategies: Weight management, Medication therapy, Medical device, Exercise, Routine screening, Diet modification Endocrine Self-Management Outcome: 3 (uncertain) Endocrine Comment: The  patient went to the ED due to overdose of insulin   Gastrointestinal Gastrointestinal Symptoms Reported: No symptoms reported   Nutrition Risk Screen (CP): No indicators present  Genitourinary Genitourinary Symptoms Reported: No symptoms reported    Integumentary Integumentary Symptoms Reported: No symptoms reported    Musculoskeletal Musculoskelatal Symptoms Reviewed: Weakness, Difficulty walking Additional Musculoskeletal Details: Chronic Low Back Pain Musculoskeletal Conditions: Back pain, Mobility limited Musculoskeletal Management Strategies: Exercise, Medication therapy, Weight management Musculoskeletal Self-Management Outcome: 3 (uncertain) Musculoskeletal Comment: The patient has not worked in a year and has applied for disability Falls in the past year?: No Number of falls in past year: 1 or less Was there an injury with Fall?: No Fall Risk Category Calculator: 0 Patient Fall Risk Level: Low Fall Risk Patient at Risk for Falls Due to: Impaired mobility, Medication side effect Fall risk Follow up: Falls prevention discussed  Psychosocial Psychosocial Symptoms Reported: No symptoms reported         There were no vitals filed for this visit.  Medications Reviewed Today     Reviewed by Claudene Crystal, RN (Case Manager) on 09/10/23 at 1522  Med List Status: <None>   Medication Order Taking? Sig Documenting Provider Last Dose Status Informant  albuterol  (VENTOLIN  HFA) 108 (90 Base) MCG/ACT inhaler [id number] No Inhale 2 puffs into the lungs every 6 (six) hours as needed for wheezing or shortness of breath. Tona Francis, NP Unknown Active Self, Pharmacy Records  aspirin  EC 81 MG tablet [id number] No Take 1 tablet (81 mg total)  by mouth daily. Swallow whole.  Patient not taking: Reported on 09/10/2023   Alexander, Natalie, DO Not Taking Active   Continuous Glucose Receiver ([address] [place] READER) DEVI [id number]  Use as directed with continuous glucose monitor Melodi Sprung, DO  Active   Continuous Glucose Sensor (FREESTYLE LIBRE 3 PLUS SENSOR) [place] [id number]  Place 1 sensor on the skin every 15 days. Bluford Burkitt, NP  Active   cyclobenzaprine  (FLEXERIL ) 10 MG tablet [id number] No Take 1 tablet (10 mg total) by mouth 3 (three) times daily as needed for muscle spasms. Alexander, Natalie, DO Unknown Active   insulin  aspart (NOVOLOG ) 100 UNIT/ML FlexPen [id number]  Inject 0-17 Units into the skin 3 (three) times daily with meals. CBG < 70: NO insulin ; CBG 70 - 120: 5 units; CBG 121 - 150: 7 unit; CBG 151 - 200: 9 units; CBG 201 - 250: 11 units; CBG 251 - 300: 13 units; CBG 301 - 350: 15 units; CBG 351 - 400: 17 units Melodi Sprung, DO  Active   insulin  glargine (LANTUS ) 100 UNIT/ML Solostar Pen [id number]  Inject 40 Units into the skin daily. Alexander, Natalie, DO  Active   nicotine  (NICODERM CQ  - DOSED IN MG/24 HOURS) 21 mg/24hr patch [id number] No Place 1 patch (21 mg total) onto the skin daily.  Patient not taking: Reported on 09/10/2023   Melodi Sprung, DO Not Taking Active   pantoprazole  (PROTONIX ) 40 MG tablet [id number]  Take 1 tablet (40 mg total) by mouth daily at 12 noon.  Patient not taking: Reported on 08/28/2023   Alexander, Natalie, DO  Active   promethazine  (PHENERGAN ) 25 MG tablet [id number]  Take 0.5 tablets (12.5 mg total) by mouth every 6 (six) hours as needed for nausea or vomiting.  Patient not taking: Reported on 08/28/2023   Alexander, Natalie, DO  Active   Med List Note Ronnald Coil, RN 10/29/16 1417): PT CURRENTLY TAKES NO MEDICATIONS PER PT (10-29-16)            Recommendation:   Follow up with Diabetic Education  Continue monitoring with CBG Follow up with provider regarding LBP  Follow Up Plan:   Telephone follow-up in 1 week  Gareld June, BSN, RN [place]  VBCI - [facility] RN Care Manager [phone]

## 2023-09-10 NOTE — Patient Instructions (Signed)
 Visit Information  Thank you for taking time to visit with me today. Please don't hesitate to contact me if I can be of assistance to you before our next scheduled telephone appointment.  Our next appointment is by telephone on Wednesday May 7th at 12pm  Following is a copy of your care plan:   Goals Addressed             This Visit's Progress    VBCI Transitions of Care (TOC) Care Plan       Problems: (reviewed 09/10/23) Recent Hospitalization for treatment of DMII, ED visit 5/5 due to overdosing himself on insulin  Diet/Nutrition/Food Resources The patient currently receives food benefits but states he used the money to buy food that is now not on his diet  and Medication management barrier this is a new diagnosis for him and he is trying to understand the new medication Referral to Diabetic Educator  Goal: (reviewed 09/10/23) Over the next 30 days, the patient will not experience hospital readmission  Interventions: (reviewed 09/10/23)  Diabetes Interventions: Assessed patient's understanding of A1c goal: <8% Provided education to patient about basic DM disease process Reviewed medications with patient and discussed importance of medication adherence Counseled on importance of regular laboratory monitoring as prescribed Discussed plans with patient for ongoing care management follow up and provided patient with direct contact information for care management team Screening for signs and symptoms of depression related to chronic disease state  Assessed social determinant of health barriers Lab Results  Component Value Date   HGBA1C 12.4 (H) 08/21/2023  Referral to Social Work for [employer] needs - Arts administrator, Transportation, Utilities  Patient Self Care Activities: (reviewed 09/10/23) Attend all scheduled provider appointments Call pharmacy for medication refills 3-7 days in advance of running out of medications Call provider office for new concerns or questions  Notify RN Care Manager of TOC  call rescheduling needs Participate in Transition of Care Program/Attend TOC scheduled calls Take medications as prescribed   check feet daily for cuts, sores or redness take the blood sugar log to all doctor visits set goal weight fill half of plate with vegetables limit fast food meals to no more than 1 per week manage portion size prepare main meal at home 3 to 5 days each week set a realistic goal  Plan:  Telephone follow up appointment with care management team member scheduled for:  Wednesday May 14th at 12:00pm        The patient has been provided with contact information for the care management team and has been advised to call with any health related questions or concerns.   Please call the care guide team at [phone] if you need to cancel or reschedule your appointment.   Please call the Suicide and Crisis Lifeline: 988 call the [place]  National Suicide Prevention Lifeline: 219-090-7016 or TTY: 906 316 1386 TTY [phone]) to talk to a trained counselor if you are experiencing a Mental Health or Behavioral Health Crisis or need someone to talk to.  Gareld June, BSN, RN [place]  VBCI - [employer] [facility] RN Care Manager [phone]

## 2023-09-10 NOTE — Progress Notes (Signed)
 Called patient in hopes to re-schedule missed diabetes appointment at start of week.  Left HIPAA compliant message for patient to return my call at their convenience.  Direct callback number left for patient.

## 2023-09-11 ENCOUNTER — Encounter

## 2023-09-11 ENCOUNTER — Telehealth: Payer: Self-pay

## 2023-09-11 ENCOUNTER — Telehealth: Payer: Self-pay | Admitting: *Deleted

## 2023-09-11 NOTE — Telephone Encounter (Signed)
 Patient was identified as falling into the [place] Measure - Diabetes.   Patient was: Appointment already scheduled for:  11/27/23.  Pt seen in office 08/28/23 by Bluford Burkitt, NP next follow up is scheduled for 11/27/23.  Pt is also being followed by Pharmacist and has an upcoming appointment with Dietician.

## 2023-09-11 NOTE — Progress Notes (Signed)
 Complex Care Management Note Care Guide Note  09/11/2023 Name: Barry Raymond MRN: [id number] DOB: 02-Oct-1965   Complex Care Management Outreach Attempts: An unsuccessful telephone outreach was attempted today to offer the patient information about available complex care management services.  Follow Up Plan:  Additional outreach attempts will be made to offer the patient complex care management information and services.   Encounter Outcome:  No Answer [name] Care Guide  Direct Dial:[phone] [phone] Website: [email or web address]

## 2023-09-15 ENCOUNTER — Telehealth: Payer: Self-pay | Admitting: *Deleted

## 2023-09-15 DIAGNOSIS — Z419 Encounter for procedure for purposes other than remedying health state, unspecified: Secondary | ICD-10-CM | POA: Diagnosis not present

## 2023-09-15 NOTE — Progress Notes (Signed)
 Complex Care Management Note Care Guide Note  09/15/2023 Name: Barry Raymond MRN: [id number] DOB: June 21, 1965   Complex Care Management Outreach Attempts: A second unsuccessful outreach was attempted today to offer the patient with information about available complex care management services.  Follow Up Plan:  Additional outreach attempts will be made to offer the patient complex care management information and services.   Encounter Outcome:  No Answer [name] Care Guide  Direct [phone] [phone] Website: [email or web address]

## 2023-09-16 ENCOUNTER — Telehealth: Payer: Self-pay | Admitting: *Deleted

## 2023-09-16 NOTE — Progress Notes (Signed)
 Complex Care Management Note Care Guide Note  09/16/2023 Name: Barry Raymond MRN: [id number] DOB: 1966-03-17  Barry Raymond is a 58 y.o. year old male who is a primary care patient of Bluford Burkitt, NP . The community resource team was consulted for assistance with Transportation Needs , Food Insecurity, and homeless   SDOH screenings and interventions completed:  Yes  Social Drivers of Health From This Encounter   Housing: High Risk (09/16/2023)   Housing Stability Vital Sign    Unable to Pay for Housing in the Last Year: Yes    Number of Times Moved in the Last Year: 0    Homeless in the Last Year: No    SDOH Interventions Today    Flowsheet Row Most Recent Value  SDOH Interventions   Food Insecurity Interventions Community Resources Provided, [id number] Referral  [offered food banks and also a [place] [employer]]  Housing Interventions Community Resources Provided  [also provided [employer] and mailed housing ,]  Transportation Interventions Community Resources Provided, Payor Benefit  [patient has Well care and also provided information about [employer]]  Utilities Interventions Intervention Not Indicated, Other (Comment)  [Patient is according to him sleeping on floors and couches and couch surfing]       I was cut off mid call and tried to call patient back and said the number was not available at this time I send the requested information and if the patient requires more resources he can reach back as previous messages left  Care guide performed the following interventions: Patient provided with information about care guide support team and interviewed to confirm resource needs.  Follow Up Plan:  No further follow up planned at this time. The patient has been provided with needed resources.  Encounter Outcome:  Patient Visit Completed  Shaquera Ansley [place]  [facility] HealthPopulation Health Care Guide  Direct Dial:[phone] Fax:[phone] Website: [email or web address]

## 2023-09-17 ENCOUNTER — Telehealth: Payer: Self-pay | Admitting: *Deleted

## 2023-09-17 ENCOUNTER — Other Ambulatory Visit: Payer: Self-pay

## 2023-09-17 NOTE — Progress Notes (Signed)
 Complex Care Management Note Care Guide Note  09/17/2023 Name: Barry Raymond MRN: [id number] DOB: 05/01/1966   Complex Care Management Outreach Attempts: An unsuccessful telephone outreach was attempted today to offer the patient information about available complex care management services.  Follow Up Plan:  Additional outreach attempts will be made to offer the patient complex care management information and services.   Encounter Outcome:  No Answer  [name]Population Health Care Guide  Direct Dial:[phone] Fax:[phone] Website: [email or web address]

## 2023-09-17 NOTE — Transitions of Care (Post Inpatient/ED Visit) (Signed)
 Transition of Care week 4  Visit Note  09/17/2023  Name: Barry Raymond MRN: [id number]          DOB: [birth year]  Situation: Patient enrolled in [facility] 30-day program. Visit completed with Barry Raymond by telephone.   Background:   Past Medical History:  Diagnosis Date   Arthritis    COPD (chronic obstructive pulmonary disease) (HCC)    GERD (gastroesophageal reflux disease)    OCC   Lipoma of back    Noted on xray today   Morbid obesity (HCC)    Sleep apnea    NO CPAP   Umbilical hernia     Assessment: Patient Reported Symptoms: Cognitive Cognitive Status: Alert and oriented to person, place, and time   Healing Pattern: Average Health Facilitated by: Rest  Neurological Neurological Review of Symptoms: No symptoms reported    HEENT HEENT Symptoms Reported: No symptoms reported      Cardiovascular Cardiovascular Symptoms Reported: No symptoms reported    Respiratory Respiratory Symptoms Reported: No symptoms reported    Endocrine Patient reports the following symptoms related to hypoglycemia or hyperglycemia : No symptoms reported Is patient diabetic?: Yes Is patient checking blood sugars at home?: Yes Endocrine Conditions: Diabetes Endocrine Management Strategies: Weight management, Exercise, Routine screening, Medication therapy, Medical device, Diet modification Endocrine Self-Management Outcome: 3 (uncertain) Endocrine Comment: The patient needs encouragement and education regarding his DM  Gastrointestinal Gastrointestinal Symptoms Reported: No symptoms reported   Nutrition Risk Screen (CP): No indicators present  Genitourinary Genitourinary Symptoms Reported: No symptoms reported    Integumentary Integumentary Symptoms Reported: No symptoms reported    Musculoskeletal Musculoskelatal Symptoms Reviewed: Weakness, Difficulty walking Additional Musculoskeletal Details: Weight of 300lb, Chronic LBP Musculoskeletal Conditions: Back pain, Mobility  limited Musculoskeletal Management Strategies: Exercise, Medication therapy, Weight management Musculoskeletal Self-Management Outcome: 3 (uncertain) Musculoskeletal Comment: Unchanged Falls in the past year?: No Number of falls in past year: 1 or less Was there an injury with Fall?: No Fall Risk Category Calculator: 0 Patient Fall Risk Level: Low Fall Risk Patient at Risk for Falls Due to: No Fall Risks Fall risk Follow up: Falls evaluation completed  Psychosocial Psychosocial Symptoms Reported: No symptoms reported         There were no vitals filed for this visit.  Medications Reviewed Today     Reviewed by Barry Crystal, RN (Case Manager) on 09/17/23 at 1312  Med List Status: <None>   Medication Order Taking? Sig Documenting Provider Last Dose Status Informant  albuterol  (VENTOLIN  HFA) 108 (90 Base) MCG/ACT inhaler [id number] No Inhale 2 puffs into the lungs every 6 (six) hours as needed for wheezing or shortness of breath. Tona Francis, NP Unknown Active Self, Pharmacy Records  aspirin  EC 81 MG tablet [id number] No Take 1 tablet (81 mg total) by mouth daily. Swallow whole.  Patient not taking: Reported on 09/10/2023   Alexander, Natalie, DO Not Taking Active   Continuous Glucose Receiver ([address] [place]ne READER) DEVI [id number]  Use as directed with continuous glucose monitor Melodi Sprung, DO  Active   Continuous Glucose Sensor (FREESTYLE LIBRE 3 PLUS SENSOR) [place] [id number]  Place 1 sensor on the skin every 15 days. Bluford Burkitt, NP  Active   cyclobenzaprine  (FLEXERIL ) 10 MG tablet [id number] No Take 1 tablet (10 mg total) by mouth 3 (three) times daily as needed for muscle spasms. Alexander, Natalie, DO Unknown Active   insulin  aspart (NOVOLOG ) 100 UNIT/ML FlexPen [id number]  Inject 0-17 Units into the skin 3 (three) times daily with meals.  CBG < 70: NO insulin ; CBG 70 - 120: 5 units; CBG 121 - 150: 7 unit; CBG 151 - 200: 9 units; CBG 201 - 250: 11 units; CBG 251 - 300:  13 units; CBG 301 - 350: 15 units; CBG 351 - 400: 17 units Melodi Sprung, DO  Active   insulin  glargine (LANTUS ) 100 UNIT/ML Solostar Pen [id number]  Inject 40 Units into the skin daily. Alexander, Natalie, DO  Active   nicotine  (NICODERM CQ  - DOSED IN MG/24 HOURS) 21 mg/24hr patch [id number] No Place 1 patch (21 mg total) onto the skin daily.  Patient not taking: Reported on 09/10/2023   Alexander, Natalie, DO Not Taking Active   pantoprazole  (PROTONIX ) 40 MG tablet [id number] No Take 1 tablet (40 mg total) by mouth daily at 12 noon.  Patient not taking: Reported on 08/28/2023   Alexander, Natalie, DO Not Taking Active   promethazine  (PHENERGAN ) 25 MG tablet [id number] No Take 0.5 tablets (12.5 mg total) by mouth every 6 (six) hours as needed for nausea or vomiting.  Patient not taking: Reported on 08/28/2023   Alexander, Natalie, DO Not Taking Active   Med List Note Ronnald Coil, RN 10/29/16 1417): PT CURRENTLY TAKES NO MEDICATIONS PER PT (10-29-16)            Recommendation:   TOC Outreach next week for week 5 and then will transfer to CCM. The patient requires education and assistance with managing his diabetes. He is wearing his CGM and he is giving himself the SSI based upon the values. He has missed some phone calls from the Social Worker who is trying to assist him housing and transportation resources. CM reached out to the team member. The patient currently sleeps on the floor at his daughter's house. He did receive his food assistance and states he made better food choices for himself. BS have ranged 69 - 170.  Follow Up Plan:   Telephone follow-up in 1 week  Barry Raymond, BSN, RN [place]  VBCI - [facility] Health RN Care Manager [phone]

## 2023-09-17 NOTE — Patient Instructions (Signed)
 Visit Information  Thank you for taking time to visit with me today. Please don't hesitate to contact me if I can be of assistance to you before our next scheduled telephone appointment.  Our next appointment is by telephone on Wednesday May 21st at 12pm  Following is a copy of your care plan:   Goals Addressed             This Visit's Progress    VBCI Transitions of Care (TOC) Care Plan   On track    Problems: (reviewed 09/17/23) Recent Hospitalization for treatment of DMII, ED visit 5/5 due to overdosing himself on insulin  Diet/Nutrition/Food Resources The patient currently receives food benefits but states he used the money to buy food that is now not on his diet  and Medication management barrier this is a new diagnosis for him and he is trying to understand the new medication Referral to Diabetic Educator/Nutritionist - 6/26  Goal: (reviewed 09/17/23) Over the next 30 days, the patient will not experience hospital readmission  Interventions: (reviewed 09/17/23)  Diabetes Interventions: Assessed patient's understanding of A1c goal: <8% Provided education to patient about basic DM disease process Reviewed medications with patient and discussed importance of medication adherence Counseled on importance of regular laboratory monitoring as prescribed Discussed plans with patient for ongoing care management follow up and provided patient with direct contact information for care management team Screening for signs and symptoms of depression related to chronic disease state  Assessed social determinant of health barriers Lab Results  Component Value Date   HGBA1C 12.4 (H) 08/21/2023  Referral to Social Work for [employer] needs - Arts administrator, Transportation, Utilities (completed)  Patient Self Care Activities: (reviewed 09/17/23) Attend all scheduled provider appointments Call pharmacy for medication refills 3-7 days in advance of running out of medications Call provider office for new concerns  or questions  Notify RN Care Manager of TOC call rescheduling needs Participate in Transition of Care Program/Attend TOC scheduled calls Take medications as prescribed   check feet daily for cuts, sores or redness take the blood sugar log to all doctor visits set goal weight fill half of plate with vegetables limit fast food meals to no more than 1 per week manage portion size prepare main meal at home 3 to 5 days each week set a realistic goal  Plan:  Telephone follow up appointment with care management team member scheduled for:  Wednesday May 21st at 12:00pm        The patient verbalized understanding of instructions, educational materials, and care plan provided today and agreed to receive a mailed copy of patient instructions, educational materials, and care plan.   The patient has been provided with contact information for the care management team and has been advised to call with any health related questions or concerns.   Please call the care guide team at [phone] if you need to cancel or reschedule your appointment.   Please call the Suicide and Crisis Lifeline: 988 call the [place]  National Suicide Prevention Lifeline: 364-762-8041 or TTY: 289-724-9307 TTY (601) 426-6465) to talk to a trained counselor if you are experiencing a Mental Health or Behavioral Health Crisis or need someone to talk to.  Gareld June, BSN, RN [place]  VBCI - [employer] Health RN Care Manager [phone]

## 2023-09-18 ENCOUNTER — Telehealth: Payer: Self-pay | Admitting: *Deleted

## 2023-09-18 NOTE — Progress Notes (Signed)
 cComplex Care Management Note Care Guide Note  09/18/2023 Name: Barry Raymond MRN: [id number] DOB: 04-Apr-1966   Complex Care Management Outreach Attempts: A third unsuccessful outreach was attempted today to offer the patient with information about available complex care management services.  Follow Up Plan:  No further outreach attempts will be made at this time. We have been unable to contact the patient to offer or enroll patient in complex care management services. Mail box still full resources Mailed to patient  Encounter Outcome:  No Answer  [name] HealthPopulation Health Care Guide  Direct Dial:[phone] Fax:[phone] Website: [email or web address]

## 2023-09-23 ENCOUNTER — Telehealth: Payer: Self-pay

## 2023-09-23 ENCOUNTER — Encounter: Payer: Self-pay | Admitting: Nurse Practitioner

## 2023-09-23 NOTE — Progress Notes (Signed)
 Complex Care Management Care Guide Note  09/23/2023 Name: Barry Raymond MRN: [id number] DOB: 06/10/1965  Barry Raymond is a 58 y.o. year old male who is a primary care patient of Bluford Burkitt, NP and is actively engaged with the care management team. I reached out to Billee Buddle by phone today to assist with re-scheduling  with the Pharmacist.  Follow up plan: Unsuccessful telephone outreach attempt made. A HIPAA compliant phone message was left for the patient providing contact information and requesting a return call.  Lenton Rail , RMA     [facility]  [facility], [facility] Guide  Direct Dial: [phone]  Website: [email or web address]

## 2023-09-24 ENCOUNTER — Other Ambulatory Visit: Payer: Self-pay

## 2023-09-26 NOTE — Transitions of Care (Post Inpatient/ED Visit) (Signed)
 09/26/2023  Patient ID: Barry Raymond, male   DOB: [birth year], 58 y.o.   MRN: [id number]  The patient is not answering attempts for last TOC visit note. TOC program has been completed  Gareld June, BSN, RN [place]  VBCI - [facility] Health RN Care Manager [phone]

## 2023-09-26 NOTE — Transitions of Care (Post Inpatient/ED Visit) (Unsigned)
 09/26/2023  Patient ID: Barry Raymond, male   DOB: 03-13-1966, 58 y.o.   MRN: [id number]  The patient returned the call CM left. Reviewed closing from TOC. The patient states his phone does not work well and he has missed phone calls from the CM and SW. He did receive a packet in the mail regarding resources for food and housing but he states it is for [place] and he lives in [place]. He called the SW but she is out of the office until 09/30/23. He will contact her at that time. Reviewed medication and compliance due to the holiday weekend. The patient states he has is sensors and insulin .  Gareld June, BSN, RN [place]  VBCI - Population Health RN Care Manager [phone]

## 2023-09-30 ENCOUNTER — Telehealth: Payer: Self-pay | Admitting: Nurse Practitioner

## 2023-09-30 ENCOUNTER — Other Ambulatory Visit: Payer: Self-pay | Admitting: Nurse Practitioner

## 2023-09-30 ENCOUNTER — Telehealth: Payer: Self-pay | Admitting: *Deleted

## 2023-09-30 DIAGNOSIS — E119 Type 2 diabetes mellitus without complications: Secondary | ICD-10-CM

## 2023-09-30 MED ORDER — INSULIN PEN NEEDLE 32G X 4 MM MISC
5 refills | Status: DC
Start: 2023-09-30 — End: 2024-02-17

## 2023-09-30 NOTE — Telephone Encounter (Signed)
 Last Fill: 08/26/23  Last OV: 08/28/23 Next OV: 11/27/23  Routing to provider for review/authorization.    Copied from CRM [id number]. Topic: Clinical - Prescription Issue >> Sep 30, 2023  2:10 PM Felizardo Hotter wrote: Reason for CRM: Pt called stated he needs needles for his insulin . Please call pt at [phone] or send to [facility] [address]), [place] [address] [place] ([address]) [place] [address] Phone: [phone] Fax: [phone]

## 2023-09-30 NOTE — Progress Notes (Signed)
 Complex Care Management Note Care Guide Note  09/30/2023 Name: Barry Raymond MRN: [id number] DOB: 07/29/1965   Complex Care Management Outreach Attempts: An unsuccessful telephone outreach was attempted today to offer the patient information about available complex care management services. Patient left voicemail returned call no answer and mailed [employer] , housing and SSDI resources  Follow Up Plan:  Additional outreach attempts will be made to offer the patient complex care management information and services.   Encounter Outcome:  No Answer [name] Care Guide  Direct Dial:[phone] Fax:[phone] Website: [email or web address]

## 2023-10-01 ENCOUNTER — Telehealth: Payer: Self-pay | Admitting: *Deleted

## 2023-10-01 ENCOUNTER — Ambulatory Visit: Payer: Self-pay

## 2023-10-01 NOTE — Progress Notes (Signed)
 Complex Care Management Note Care Guide Note  10/01/2023 Name: [name] MRN: [id number] DOB: 1965/06/23  Barry Raymond is a 58 y.o. year old male who is a primary care patient of Bluford Burkitt, NP . The community resource team was consulted for assistance with Food Insecurity and Financial Difficulties related to housing   SDOH screenings and interventions completed:  Yes   Patient emailed information on housing and food and applying for [facility]      Care guide performed the following interventions: Patient provided with information about care guide support team and interviewed to confirm resource needs.  Follow Up Plan:  No further follow up planned at this time. The patient has been provided with needed resources.  Encounter Outcome:  Patient Visit Completed Patty Lopezgarcia [place]  [facility] HealthPopulation Health Care Guide  Direct Dial:[phone] Fax:[phone] Website: [email or web address]

## 2023-10-01 NOTE — Telephone Encounter (Signed)
 Per chart review, needles were sent to his pharmacy yesterday-Receipt confirmed by pharmacy 09/30/2023 4:54 PM. Patient to be notified by agent.   Copied From CRM [id number]. Reason for Triage: patient is a diabetic and he do not have a needles and insurance will not pay

## 2023-10-06 ENCOUNTER — Other Ambulatory Visit: Payer: Self-pay | Admitting: Nurse Practitioner

## 2023-10-06 ENCOUNTER — Telehealth: Payer: Self-pay

## 2023-10-06 DIAGNOSIS — E119 Type 2 diabetes mellitus without complications: Secondary | ICD-10-CM

## 2023-10-06 MED ORDER — INSULIN ASPART 100 UNIT/ML FLEXPEN: 0.0000 [IU] | PEN_INJECTOR | Freq: Three times a day (TID) | SUBCUTANEOUS | 1 refills | Status: DC

## 2023-10-06 MED ORDER — INSULIN GLARGINE 100 UNIT/ML SOLOSTAR PEN: 40.0000 [IU] | PEN_INJECTOR | Freq: Every day | SUBCUTANEOUS | 1 refills | Status: DC

## 2023-10-06 NOTE — Telephone Encounter (Signed)
 Copied from CRM [id number]. Topic: General - Other >> Oct 06, 2023 12:42 PM Dorisann Garre T wrote: Reason for CRM: patient is needing a refill on his purple needles he uses for his diabetes he stated

## 2023-10-06 NOTE — Telephone Encounter (Signed)
 Copied from CRM [id number]. Topic: Clinical - Medication Question >> Oct 06, 2023  3:00 PM Aisha D wrote: Reason for CRM: Pt stated that he is out of the insulin  aspart (NOVOLOG ) 100 UNIT/ML FlexPen. Pt stated that he wants to have the pens approved today if possible. Pt would like to receive a call back with an update regarding the request.

## 2023-10-06 NOTE — Telephone Encounter (Signed)
 Needles sent in on 09-30-23 to contact pharmacy

## 2023-10-06 NOTE — Addendum Note (Signed)
 Addended by: Jackline Maser on: 10/06/2023 04:39 PM   Modules accepted: Orders

## 2023-10-06 NOTE — Telephone Encounter (Signed)
 Called and informed pt.

## 2023-10-07 NOTE — Progress Notes (Signed)
 Complex Care Management Care Guide Note  10/07/2023 Name: Barry Raymond MRN: [id number] DOB: [birth year]  Barry Raymond is a 58 y.o. year old male who is a primary care patient of Bluford Burkitt, NP and is actively engaged with the care management team. I reached out to Billee Buddle by phone today to assist with re-scheduling  with the Pharmacist.  Follow up plan: Unsuccessful telephone outreach attempt made. A HIPAA compliant phone message was left for the patient providing contact information and requesting a return call.  Barry Raymond , RMA     [facility] Health  [facility] Guide  Direct Dial: [phone]  Website: [email or web address]

## 2023-10-08 MED ORDER — BASAGLAR KWIKPEN 100 UNIT/ML ~~LOC~~ SOPN: 40.0000 [IU] | PEN_INJECTOR | Freq: Every day | SUBCUTANEOUS | 2 refills | Status: DC

## 2023-10-08 NOTE — Addendum Note (Signed)
 Addended by: Bluford Burkitt on: 10/08/2023 09:58 AM   Modules accepted: Orders

## 2023-10-16 DIAGNOSIS — Z419 Encounter for procedure for purposes other than remedying health state, unspecified: Secondary | ICD-10-CM | POA: Diagnosis not present

## 2023-10-21 ENCOUNTER — Other Ambulatory Visit: Payer: Self-pay | Admitting: Nurse Practitioner

## 2023-10-21 DIAGNOSIS — E119 Type 2 diabetes mellitus without complications: Secondary | ICD-10-CM

## 2023-10-21 MED ORDER — DEXCOM G7 SENSOR MISC: 11 refills | Status: DC

## 2023-10-30 ENCOUNTER — Encounter: Attending: Osteopathic Medicine | Admitting: Dietician

## 2023-10-30 ENCOUNTER — Encounter: Payer: Self-pay | Admitting: Dietician

## 2023-10-30 VITALS — Ht 68.0 in | Wt 319.4 lb

## 2023-10-30 DIAGNOSIS — Z6841 Body Mass Index (BMI) 40.0 and over, adult: Secondary | ICD-10-CM | POA: Insufficient documentation

## 2023-10-30 DIAGNOSIS — E119 Type 2 diabetes mellitus without complications: Secondary | ICD-10-CM | POA: Diagnosis not present

## 2023-10-30 NOTE — Patient Instructions (Signed)
 Eat a meal or snack every 3-5 hours during the day., at least 3 times a day.  Keep canned or dried beans on hand, also low sodium can or pouch tuna or salmon or chicken for easy and inexpensive protein foods. Eggs and peanut butter are also lower cost than most meats. Keep portions of starches (such as potatoes, rice, or pasta) to 1 cup, like a small fist, or less.  Eat plenty of vegetables with meals -- canned (low sodium or at least rinsed off), frozen, or fresh. Keep up walking regularly, even if walking a few minutes at a time.

## 2023-10-30 NOTE — Progress Notes (Signed)
 Diabetes Self-Management Education  Visit Type: First/Initial  Appt. Start Time: 1030 Appt. End Time: 1210  10/30/2023  [name], identified by name and date of birth, is a 58 y.o. male with a diagnosis of Diabetes: Type 2.   ASSESSMENT  Height 5' 8 (1.727 m), weight (!) 319 lb 6.4 oz (144.9 kg). Body mass index is 48.56 kg/m.   Diabetes Self-Management Education - 10/30/23 1044       Visit Information   Visit Type First/Initial      Initial Visit   Diabetes Type Type 2    Date Diagnosed 08/2023    Are you currently following a meal plan? No    Are you taking your medications as prescribed? Yes      Health Coping   How would you rate your overall health? Fair      Psychosocial Assessment   What is the hardest part about your diabetes right now, causing you the most concern, or is the most worrisome to you about your diabetes?   Making healty food and beverage choices;Getting support / problem solving;Checking blood sugar;Being active    Self-care barriers Lack of material resources    Preferred Learning Style Auditory;Hands on    Learning Readiness Change in progress    How often do you need to have someone help you when you read instructions, pamphlets, or other written materials from your doctor or pharmacy? 1 - Never    What is the last grade level you completed in school? GED      Pre-Education Assessment   Patient understands the diabetes disease and treatment process. Needs Instruction    Patient understands incorporating nutritional management into lifestyle. Needs Instruction    Patient undertands incorporating physical activity into lifestyle. Needs Instruction    Patient understands using medications safely. Needs Review    Patient understands monitoring blood glucose, interpreting and using results Needs Review    Patient understands prevention, detection, and treatment of acute complications. Needs Instruction    Patient understands prevention,  detection, and treatment of chronic complications. Needs Instruction    Patient understands how to develop strategies to address psychosocial issues. Needs Instruction    Patient understands how to develop strategies to promote health/change behavior. Needs Review      Complications   Last HgB A1C per patient/outside source 12.4 %   08/21/23   How often do you check your blood sugar? > 4 times/day   CGM   Fasting Blood glucose range (mg/dL) 29-870   19-879 per patient   Postprandial Blood glucose range (mg/dL) 29-870;869-820    Number of hypoglycemic episodes per month 1    Can you tell when your blood sugar is low? Yes    Have you had a dilated eye exam in the past 12 months? No    Have you had a dental exam in the past 12 months? No    Are you checking your feet? No      Dietary Intake   Breakfast sandwich; boiled eggs; sometimes skips, not hungry    Lunch 12-1pm sandwich meat +/or cheese/    Snack (afternoon) varies -- chips/ crackers; tries to limit snacks    Dinner 5-6pm sandwich; boiled chicken if avail, pan-fried potatoes with onions, some veg ie brocc    Snack (evening) often snacks during the night -- chips, crackers    Beverage(s) water, occasional diet soda or sugar free flavoring if available      Activity / Exercise  Activity / Exercise Type Light (walking / raking leaves)    How many days per week do you exercise? 2    How many minutes per day do you exercise? 45    Total minutes per week of exercise 90      Patient Education   Previous Diabetes Education No    Disease Pathophysiology Definition of diabetes, type 1 and 2, and the diagnosis of diabetes;Factors that contribute to the development of diabetes    Being Active Role of exercise on diabetes management, blood pressure control and cardiac health.    Monitoring Taught/evaluated CGM (comment);Interpreting lab values - A1C, lipid, urine microalbumina.;Identified appropriate SMBG and/or A1C goals.;Daily foot exams     Acute complications Taught prevention, symptoms, and  treatment of hypoglycemia - the 15 rule.;Discussed and identified patients' prevention, symptoms, and treatment of hyperglycemia.    Chronic complications Relationship between chronic complications and blood glucose control    Diabetes Stress and Support Role of stress on diabetes      Individualized Goals (developed by patient)   Nutrition Follow meal plan discussed    Problem Solving Eating Pattern;Addressing barriers to behavior change      Outcomes   Expected Outcomes Demonstrated interest in learning. Expect positive outcomes    Future DMSE 4-6 wks          Individualized Plan for Diabetes Self-Management Training:   Learning Objective:  Patient will have a greater understanding of diabetes self-management. Patient education plan is to attend individual and/or group sessions per assessed needs and concerns.   Plan:   Patient Instructions  Eat a meal or snack every 3-5 hours during the day., at least 3 times a day.  Keep canned or dried beans on hand, also low sodium can or pouch tuna or salmon or chicken for easy and inexpensive protein foods. Eggs and peanut butter are also lower cost than most meats. Keep portions of starches (such as potatoes, rice, or pasta) to 1 cup, like a small fist, or less.  Eat plenty of vegetables with meals -- canned (low sodium or at least rinsed off), frozen, or fresh. Keep up walking regularly, even if walking a few minutes at a time.   Expected Outcomes:  Demonstrated interest in learning. Expect positive outcomes  Education material provided: Getting Started with Type 2 diabetes ADA booklet; plate planner with food lists, sample menus  If problems or questions, patient to contact team via:  Phone  Future DSME appointment: 4-6 wks 12/10/23

## 2023-10-31 ENCOUNTER — Other Ambulatory Visit: Payer: Self-pay | Admitting: Nurse Practitioner

## 2023-10-31 ENCOUNTER — Telehealth: Payer: Self-pay

## 2023-10-31 DIAGNOSIS — E119 Type 2 diabetes mellitus without complications: Secondary | ICD-10-CM

## 2023-10-31 MED ORDER — DEXCOM G7 RECEIVER DEVI
0 refills | Status: AC
Start: 2023-10-31 — End: ?

## 2023-10-31 NOTE — Telephone Encounter (Signed)
 Pt called and stated he needs a reader for his dexcom sensor he has not been able to check his sugar because he has the freestyle libre reader. He would like a call back once dexcom  reader has been sent in.     Pt also has been scheduled to discuss a podiatry referral to get his toe nails cut because he was advised not to do so his self. Pt also wants to discuss back paint hat day as well stating he needs pain meds

## 2023-11-14 ENCOUNTER — Ambulatory Visit: Admitting: Nurse Practitioner

## 2023-11-15 DIAGNOSIS — Z419 Encounter for procedure for purposes other than remedying health state, unspecified: Secondary | ICD-10-CM | POA: Diagnosis not present

## 2023-11-25 ENCOUNTER — Telehealth: Payer: Self-pay | Admitting: *Deleted

## 2023-11-25 NOTE — Progress Notes (Signed)
 Complex Care Management Note Care Guide Note  11/25/2023 Name: Barry Raymond MRN: [id number] DOB: October 19, 1965  Barry Raymond is a 58 y.o. year old male who is a primary care patient of Gretel App, NP . The community resource team was consulted for assistance with cell phone and housing   SDOH screenings and interventions completed:  Yes  Social Drivers of Health From This Encounter   Financial Resource Strain: High Risk (11/25/2023)   Overall Financial Resource Strain (CARDIA)    Difficulty of Paying Living Expenses: Hard    SDOH Interventions Today    Flowsheet Row Most Recent Value  SDOH Interventions   Financial Strain Interventions Community Resources Provided  [Patient called and left a message needing housing lists for [place] and also cell phone information]     Care guide performed the following interventions: Patient provided with information about care guide support team and interviewed to confirm resource needs.  Follow Up Plan:  No further follow up planned at this time. The patient has been provided with needed resources.  Encounter Outcome:  Patient Visit Completed  Vaani Morren Greenauer-Moran  [facility] HealthPopulation Health Care Guide  Direct Dial:[phone] [phone] Website: [email or web address]

## 2023-11-27 ENCOUNTER — Ambulatory Visit (INDEPENDENT_AMBULATORY_CARE_PROVIDER_SITE_OTHER): Admitting: Nurse Practitioner

## 2023-11-27 VITALS — BP 124/76 | HR 72 | Temp 98.3°F | Ht 68.0 in | Wt 317.8 lb

## 2023-11-27 DIAGNOSIS — R051 Acute cough: Secondary | ICD-10-CM

## 2023-11-27 DIAGNOSIS — Z794 Long term (current) use of insulin: Secondary | ICD-10-CM

## 2023-11-27 DIAGNOSIS — G8929 Other chronic pain: Secondary | ICD-10-CM | POA: Diagnosis not present

## 2023-11-27 DIAGNOSIS — E1165 Type 2 diabetes mellitus with hyperglycemia: Secondary | ICD-10-CM | POA: Diagnosis not present

## 2023-11-27 DIAGNOSIS — J441 Chronic obstructive pulmonary disease with (acute) exacerbation: Secondary | ICD-10-CM | POA: Diagnosis not present

## 2023-11-27 DIAGNOSIS — B351 Tinea unguium: Secondary | ICD-10-CM | POA: Diagnosis not present

## 2023-11-27 DIAGNOSIS — E1169 Type 2 diabetes mellitus with other specified complication: Secondary | ICD-10-CM | POA: Diagnosis not present

## 2023-11-27 DIAGNOSIS — M545 Low back pain, unspecified: Secondary | ICD-10-CM | POA: Diagnosis not present

## 2023-11-27 LAB — POCT GLYCOSYLATED HEMOGLOBIN (HGB A1C)
HbA1c POC (<> result, manual entry): 6.2 % (ref 4.0–5.6)
HbA1c, POC (controlled diabetic range): 6.2 % (ref 0.0–7.0)
HbA1c, POC (prediabetic range): 6.2 % (ref 5.7–6.4)
Hemoglobin A1C: 6.2 % — AB (ref 4.0–5.6)

## 2023-11-27 LAB — POC COVID19 BINAXNOW: SARS Coronavirus 2 Ag: NEGATIVE

## 2023-11-27 MED ORDER — ALBUTEROL SULFATE HFA 108 (90 BASE) MCG/ACT IN AERS: 2.0000 | INHALATION_SPRAY | Freq: Four times a day (QID) | RESPIRATORY_TRACT | 1 refills | Status: AC | PRN

## 2023-11-27 MED ORDER — PSEUDOEPH-BROMPHEN-DM 30-2-10 MG/5ML PO SYRP: 5.0000 mL | ORAL_SOLUTION | Freq: Four times a day (QID) | ORAL | 0 refills | Status: AC | PRN

## 2023-11-27 MED ORDER — AMOXICILLIN-POT CLAVULANATE 875-125 MG PO TABS
1.0000 | ORAL_TABLET | Freq: Two times a day (BID) | ORAL | 0 refills | Status: AC
Start: 2023-11-27 — End: ?

## 2023-11-27 NOTE — Progress Notes (Signed)
 Barry Glance, NP-C Phone: [phone]  Barry Raymond is a 58 y.o. male who presents today for follow up and cough.   Discussed the use of AI scribe software for clinical note transcription with the patient, who gave verbal consent to proceed.  History of Present Illness   Barry Raymond is a 58 year old male with diabetes who presents with toenail issues, back pain, and a cough.  He has yellow, thick toenails and is seeking podiatry care for nail management due to his diabetes. The toenails are not painful.  He experiences chronic back pain that has been persistent and unresponsive to muscle relaxers and steroids. He has had referrals to orthopedics but has not followed up.  He reports fluctuations in blood sugar levels, though they generally remain stable. He uses a Dexcom device for continuous glucose monitoring and administers Basaglar  insulin  once daily along with a rapid-acting insulin  with meals. He experiences increased urination, which he attributes to his blood sugar levels.  He has had a cough and chest congestion for the past four to five days, with symptoms including shortness of breath, tiredness, and headaches. Mucinex  has helped clear his head congestion, but he still feels chest congestion. No fever, sore throat, nausea, or vomiting. He has a history of using an inhaler, which he still has a few doses of.      Social History   Tobacco Use  Smoking Status Some Days   Current packs/day: 0.20   Average packs/day: 0.2 packs/day for 20.0 years (4.0 ttl pk-yrs)   Types: Cigarettes  Smokeless Tobacco Never  Tobacco Comments   1-2 cigarettes per week    Current Outpatient Medications on File Prior to Visit  Medication Sig Dispense Refill   Continuous Glucose Receiver (DEXCOM G7 RECEIVER) DEVI Use as directed to monitor blood glucose daily. 1 each 0   Continuous Glucose Sensor (DEXCOM G7 SENSOR) MISC Use as directed to monitor blood glucose. Change sensor every 10 days. 3 each  11   cyclobenzaprine  (FLEXERIL ) 10 MG tablet Take 1 tablet (10 mg total) by mouth 3 (three) times daily as needed for muscle spasms. 30 tablet 0   insulin  aspart (NOVOLOG ) 100 UNIT/ML FlexPen Inject 0-17 Units into the skin 3 (three) times daily with meals. CBG < 70: NO insulin ; CBG 70 - 120: 5 units; CBG 121 - 150: 7 unit; CBG 151 - 200: 9 units; CBG 201 - 250: 11 units; CBG 251 - 300: 13 units; CBG 301 - 350: 15 units; CBG 351 - 400: 17 units 15 mL 1   Insulin  Glargine (BASAGLAR  KWIKPEN) 100 UNIT/ML Inject 40 Units into the skin daily. 15 mL 2   Insulin  Pen Needle 32G X 4 MM MISC Use as directly to inject insulin  four times daily 200 each 5   pantoprazole  (PROTONIX ) 40 MG tablet Take 1 tablet (40 mg total) by mouth daily at 12 noon. 30 tablet 0   aspirin  EC 81 MG tablet Take 1 tablet (81 mg total) by mouth daily. Swallow whole. (Patient not taking: Reported on 10/30/2023)     nicotine  (NICODERM CQ  - DOSED IN MG/24 HOURS) 21 mg/24hr patch Place 1 patch (21 mg total) onto the skin daily. (Patient not taking: Reported on 09/10/2023) 28 patch 0   No current facility-administered medications on file prior to visit.     ROS see history of present illness  Objective  Physical Exam Vitals:   11/27/23 1022  BP: 124/76  Pulse: 72  Temp: 98.3 F (36.8 C)  SpO2: 94%    BP Readings from Last 3 Encounters:  11/27/23 124/76  09/08/23 125/80  08/28/23 120/82   Wt Readings from Last 3 Encounters:  11/27/23 (!) 317 lb 12.8 oz (144.2 kg)  10/30/23 (!) 319 lb 6.4 oz (144.9 kg)  09/10/23 300 lb (136.1 kg)    Physical Exam Constitutional:      General: He is not in acute distress.    Appearance: Normal appearance.  HENT:     Head: Normocephalic.     Right Ear: Tympanic membrane normal.     Left Ear: Tympanic membrane normal.     Nose: Nose normal.     Mouth/Throat:     Mouth: Mucous membranes are moist.     Pharynx: Oropharynx is clear.  Eyes:     Conjunctiva/sclera: Conjunctivae normal.      Pupils: Pupils are equal, round, and reactive to light.  Cardiovascular:     Rate and Rhythm: Normal rate and regular rhythm.     Heart sounds: Normal heart sounds.  Pulmonary:     Effort: Pulmonary effort is normal.     Breath sounds: Wheezing (global) and rhonchi (global) present.  Feet:     Right foot:     Toenail Condition: Right toenails are abnormally thick and long.     Left foot:     Toenail Condition: Left toenails are abnormally thick and long.  Lymphadenopathy:     Cervical: No cervical adenopathy.  Skin:    General: Skin is warm and dry.  Neurological:     General: No focal deficit present.     Mental Status: He is alert.  Psychiatric:        Mood and Affect: Mood normal.        Behavior: Behavior normal.      Assessment/Plan: Please see individual problem list.  Chronic obstructive pulmonary disease with acute exacerbation (HCC) Assessment & Plan: Cough, congestion and global wheezing noted. Steroids are avoided due to diabetes. Prescribe Augmentin  twice daily, albuterol  inhaler as needed, and Bromfed DM with caution for drowsiness. Encourage adequate hydration. Return precautions given to patient.   Orders: -     Albuterol  Sulfate HFA; Inhale 2 puffs into the lungs every 6 (six) hours as needed for wheezing or shortness of breath.  Dispense: 8 g; Refill: 1 -     Pseudoeph-Bromphen-DM; Take 5-10 mLs by mouth every 6 (six) hours as needed.  Dispense: 120 mL; Refill: 0 -     Amoxicillin -Pot Clavulanate; Take 1 tablet by mouth 2 (two) times daily.  Dispense: 20 tablet; Refill: 0  Type 2 diabetes mellitus with hyperglycemia, with long-term current use of insulin  (HCC) Assessment & Plan: POCT A1c today in office with significant improvement at 6.2 down from 12.4. He will continue Basaglar  once daily and NovoLog  per sliding scale with meals three times daily. Encouraged to continue working on healthy diet and regular exercise. Continue CGM.   Orders: -     POCT  glycosylated hemoglobin (Hb A1C)  Chronic midline low back pain without sciatica Assessment & Plan: Persistent pain is unresponsive to previous treatments. He is interested in physical medicine and rehabilitation. Send a referral to physical medicine and rehabilitation for further evaluation.   Orders: -     Ambulatory referral to Physical Medicine Rehab  Onychomycosis of multiple toenails with type 2 diabetes mellitus (HCC) Assessment & Plan: Thickened, yellow toenails are present. He is advised against self-trimming due to diabetes. Refer to podiatry for toenail care.  Orders: -     Ambulatory referral to Podiatry  Acute cough -     POC COVID-19 BinaxNow    Return in about 3 months (around 02/27/2024) for Follow up.   Barry Glance, NP-C [facility]

## 2023-11-28 ENCOUNTER — Ambulatory Visit: Admitting: Nurse Practitioner

## 2023-12-03 ENCOUNTER — Encounter: Payer: Self-pay | Admitting: Nurse Practitioner

## 2023-12-03 DIAGNOSIS — B351 Tinea unguium: Secondary | ICD-10-CM | POA: Insufficient documentation

## 2023-12-03 NOTE — Assessment & Plan Note (Signed)
 Cough, congestion and global wheezing noted. Steroids are avoided due to diabetes. Prescribe Augmentin  twice daily, albuterol  inhaler as needed, and Bromfed DM with caution for drowsiness. Encourage adequate hydration. Return precautions given to patient.

## 2023-12-03 NOTE — Assessment & Plan Note (Addendum)
 Persistent pain is unresponsive to previous treatments. He is interested in physical medicine and rehabilitation. Send a referral to physical medicine and rehabilitation for further evaluation.

## 2023-12-03 NOTE — Assessment & Plan Note (Signed)
 Thickened, yellow toenails are present. He is advised against self-trimming due to diabetes. Refer to podiatry for toenail care.

## 2023-12-03 NOTE — Assessment & Plan Note (Addendum)
 POCT A1c today in office with significant improvement at 6.2 down from 12.4. He will continue Basaglar  once daily and NovoLog  per sliding scale with meals three times daily. Encouraged to continue working on healthy diet and regular exercise. Continue CGM.

## 2023-12-04 ENCOUNTER — Ambulatory Visit (INDEPENDENT_AMBULATORY_CARE_PROVIDER_SITE_OTHER): Admitting: Podiatry

## 2023-12-04 DIAGNOSIS — B351 Tinea unguium: Secondary | ICD-10-CM | POA: Diagnosis not present

## 2023-12-04 DIAGNOSIS — M79674 Pain in right toe(s): Secondary | ICD-10-CM

## 2023-12-04 DIAGNOSIS — M79675 Pain in left toe(s): Secondary | ICD-10-CM

## 2023-12-04 NOTE — Progress Notes (Signed)
  Subjective:  Patient ID: Consuelo Thayne, male    DOB: March 19, 1966,  MRN: [id number]  Chief Complaint  Patient presents with   Diabetes    Nail trim    58 y.o. male returns for the above complaint.  Patient presents with thickened elongated dystrophic mycotic toenails x 10 mild pain on palpation hurts with ambulation is with pressure patient wanted to discuss treatment options.  Has not seen anyone as prior to seeing me.  Objective:  There were no vitals filed for this visit. Podiatric Exam: Vascular: dorsalis pedis and posterior tibial pulses are palpable bilateral. Capillary return is immediate. Temperature gradient is WNL. Skin turgor WNL  Sensorium: Normal Semmes Weinstein monofilament test. Normal tactile sensation bilaterally. Nail Exam: Pt has thick disfigured discolored nails with subungual debris noted bilateral entire nail hallux through fifth toenails.  Pain on palpation to the nails. Ulcer Exam: There is no evidence of ulcer or pre-ulcerative changes or infection. Orthopedic Exam: Muscle tone and strength are WNL. No limitations in general ROM. No crepitus or effusions noted.  Skin: No Porokeratosis. No infection or ulcers    Assessment & Plan:   1. Pain due to onychomycosis of toenails of both feet     Patient was evaluated and treated and all questions answered.  Onychomycosis with pain  -Nails palliatively debrided as below. -Educated on self-care  Procedure: Nail Debridement Rationale: pain  Type of Debridement: manual, sharp debridement. Instrumentation: Nail nipper, rotary burr. Number of Nails: 10  Procedures and Treatment: Consent by patient was obtained for treatment procedures. The patient understood the discussion of treatment and procedures well. All questions were answered thoroughly reviewed. Debridement of mycotic and hypertrophic toenails, 1 through 5 bilateral and clearing of subungual debris. No ulceration, no infection noted.  Return Visit-Office  Procedure: Patient instructed to return to the office for a follow up visit 3 months for continued evaluation and treatment.  Franky Blanch, DPM    No follow-ups on file.

## 2023-12-08 ENCOUNTER — Ambulatory Visit: Admitting: Dietician

## 2023-12-16 DIAGNOSIS — Z419 Encounter for procedure for purposes other than remedying health state, unspecified: Secondary | ICD-10-CM | POA: Diagnosis not present

## 2024-01-15 ENCOUNTER — Encounter: Payer: Self-pay | Admitting: Dietician

## 2024-01-15 NOTE — Progress Notes (Signed)
 Have not heard back from patient to reschedule his missed appointment from 12/08/23. Sent notification to referring provider.

## 2024-01-16 DIAGNOSIS — Z419 Encounter for procedure for purposes other than remedying health state, unspecified: Secondary | ICD-10-CM | POA: Diagnosis not present

## 2024-01-27 ENCOUNTER — Telehealth: Payer: Self-pay | Admitting: Nurse Practitioner

## 2024-01-27 DIAGNOSIS — E119 Type 2 diabetes mellitus without complications: Secondary | ICD-10-CM

## 2024-01-27 MED ORDER — DEXCOM G7 SENSOR MISC: 11 refills | Status: AC

## 2024-01-27 NOTE — Telephone Encounter (Signed)
Sensors sent  

## 2024-01-27 NOTE — Telephone Encounter (Signed)
 Copied from CRM #[id number]. Topic: Clinical - Medication Refill >> Jan 27, 2024  2:42 PM Barry Raymond wrote: Medication: Continuous Glucose Sensor  Has the patient contacted their pharmacy? Yes (Agent: If no, request that the patient contact the pharmacy for the refill. If patient does not wish to contact the pharmacy document the reason why and proceed with request.) (Agent: If yes, when and what did the pharmacy advise?)  This is the patient's preferred pharmacy:  [facility] [address] ([address]), [place] - [address] [place]) [place] [address] Phone: [phone] Fax: [phone]  Is this the correct pharmacy for this prescription? Yes If no, delete pharmacy and type the correct one.   Has the prescription been filled recently? No  Is the patient out of the medication? Yes he has not checked his sugar in two days  Has the patient been seen for an appointment in the last year OR does the patient have an upcoming appointment? Yes  Can we respond through MyChart? No  Agent: Please be advised that Rx refills may take up to 3 business days. We ask that you follow-up with your pharmacy.

## 2024-01-28 ENCOUNTER — Telehealth: Payer: Self-pay

## 2024-01-28 ENCOUNTER — Other Ambulatory Visit (HOSPITAL_COMMUNITY): Payer: Self-pay

## 2024-01-28 NOTE — Telephone Encounter (Unsigned)
 Copied from CRM #[id number]. Topic: Clinical - Medication Refill >> Jan 27, 2024  2:42 PM Rosina BIRCH wrote: Medication: Continuous Glucose Sensor  Has the patient contacted their pharmacy? Yes (Agent: If no, request that the patient contact the pharmacy for the refill. If patient does not wish to contact the pharmacy document the reason why and proceed with request.) (Agent: If yes, when and what did the pharmacy advise?)  This is the patient's preferred pharmacy:  [facility] [address]), [place] - [address] [place]) [place] [address] Phone: [phone] Fax: [phone]  Is this the correct pharmacy for this prescription? Yes If no, delete pharmacy and type the correct one.   Has the prescription been filled recently? No  Is the patient out of the medication? Yes he has not checked his sugar in two days  Has the patient been seen for an appointment in the last year OR does the patient have an upcoming appointment? Yes  Can we respond through MyChart? No  Agent: Please be advised that Rx refills may take up to 3 business days. We ask that you follow-up with your pharmacy. >> Jan 28, 2024 11:55 AM Rosina BIRCH wrote: Patient called stating he called the pharmacy today and he stated they still do not have his sensors. I let the patient know that it was sent in on yesterday and I will let the provider know that he does not have it

## 2024-01-28 NOTE — Telephone Encounter (Unsigned)
 Copied from CRM #[id number]. Topic: Clinical - Medication Prior Auth >> Jan 28, 2024 12:00 PM Mia F wrote: Reason for CRM: Isaiah from [facility] states that pt Barry Raymond is requiring a PA for the Continuous Glucose Sensor (DEXCOM G7 SENSOR) MISC. Pt primary insurance Administrator) is allowing it but Medicaid requires a PA.  [facility] [phone]

## 2024-01-28 NOTE — Telephone Encounter (Signed)
 Copied from CRM #[id number]. Topic: Clinical - Medication Prior Auth >> Jan 28, 2024 12:00 PM Mia F wrote: Reason for CRM: Isaiah from [facility] states that pt [employer] is requiring a PA for the Continuous Glucose Sensor (DEXCOM G7 SENSOR) MISC. Pt primary insurance Administrator) is allowing it but Medicaid requires a PA.  [facility] [phone]

## 2024-01-29 ENCOUNTER — Other Ambulatory Visit (HOSPITAL_COMMUNITY): Payer: Self-pay

## 2024-01-29 NOTE — Telephone Encounter (Signed)
New prior auth submitted

## 2024-01-29 NOTE — Telephone Encounter (Signed)
 Detailed vm left informing pt that sensors have been approved

## 2024-01-29 NOTE — Telephone Encounter (Signed)
 Pharmacy Patient Advocate Encounter  Received notification from [facility] that Prior Authorization for [facility] G7 SENSOR has been APPROVED from 01/15/24 to 01/28/25   PA #/Case ID/Reference #: [id number]

## 2024-02-17 ENCOUNTER — Other Ambulatory Visit: Payer: Self-pay | Admitting: Nurse Practitioner

## 2024-02-17 DIAGNOSIS — E119 Type 2 diabetes mellitus without complications: Secondary | ICD-10-CM

## 2024-02-17 NOTE — Telephone Encounter (Unsigned)
 Copied from CRM [id number]. Topic: Clinical - Medication Refill >> Feb 17, 2024  8:42 AM Berneda FALCON wrote: Medication: Insulin  Pen Needle 32G X 4 MM MISC  Has the patient contacted their pharmacy? Yes (Agent: If no, request that the patient contact the pharmacy for the refill. If patient does not wish to contact the pharmacy document the reason why and proceed with request.) (Agent: If yes, when and what did the pharmacy advise?)  This is the patient's preferred pharmacy:  [facility] [address]), [place] - [address] [place]) [place] [address] Phone: [phone] Fax: [phone]  Is this the correct pharmacy for this prescription? Yes If no, delete pharmacy and type the correct one.   Has the prescription been filled recently? No  Is the patient out of the medication? Yes, patient has been out since Friday  Has the patient been seen for an appointment in the last year OR does the patient have an upcoming appointment? Yes  Can we respond through MyChart? No  Agent: Please be advised that Rx refills may take up to 3 business days. We ask that you follow-up with your pharmacy.

## 2024-02-19 MED ORDER — INSULIN PEN NEEDLE 32G X 4 MM MISC: 5 refills | Status: AC

## 2024-02-25 DIAGNOSIS — M5416 Radiculopathy, lumbar region: Secondary | ICD-10-CM | POA: Diagnosis not present

## 2024-02-25 DIAGNOSIS — Z599 Problem related to housing and economic circumstances, unspecified: Secondary | ICD-10-CM | POA: Diagnosis not present

## 2024-02-25 DIAGNOSIS — M48062 Spinal stenosis, lumbar region with neurogenic claudication: Secondary | ICD-10-CM | POA: Diagnosis not present

## 2024-03-04 ENCOUNTER — Other Ambulatory Visit: Payer: Self-pay | Admitting: Family Medicine

## 2024-03-04 ENCOUNTER — Encounter: Payer: Self-pay | Admitting: Family Medicine

## 2024-03-04 DIAGNOSIS — M5416 Radiculopathy, lumbar region: Secondary | ICD-10-CM

## 2024-03-05 ENCOUNTER — Ambulatory Visit: Admission: RE | Admit: 2024-03-05 | Source: Ambulatory Visit

## 2024-03-09 ENCOUNTER — Telehealth: Payer: Self-pay | Admitting: Nurse Practitioner

## 2024-03-09 NOTE — Telephone Encounter (Unsigned)
 Copied from CRM #[id number]. Topic: Clinical - Medication Refill >> Mar 09, 2024 10:15 AM Alfonso HERO wrote: Medication: Continuous Glucose Sensor (DEXCOM G7 SENSOR) MISC  Has the patient contacted their pharmacy? Yes (Agent: If no, request that the patient contact the pharmacy for the refill. If patient does not wish to contact the pharmacy document the reason why and proceed with request.) (Agent: If yes, when and what did the pharmacy advise?)  This is the patient's preferred pharmacy:  [facility] [address] ([address]), [place] - [address] [place]) [place] [address] Phone: [phone] Fax: [phone]  Is this the correct pharmacy for this prescription? Yes If no, delete pharmacy and type the correct one.   Has the prescription been filled recently? Yes  Is the patient out of the medication? Yes  Has the patient been seen for an appointment in the last year OR does the patient have an upcoming appointment? Yes  Can we respond through MyChart? Yes  Agent: Please be advised that Rx refills may take up to 3 business days. We ask that you follow-up with your pharmacy.  Has been without it since last week. Needs it ASAP

## 2024-03-09 NOTE — Telephone Encounter (Signed)
Pt informed

## 2024-03-15 ENCOUNTER — Encounter: Payer: Self-pay | Admitting: Pharmacist

## 2024-03-15 NOTE — Progress Notes (Signed)
 Pharmacy Quality Measure Review  This patient is appearing on a report for being at risk of failing the Kidney Health Evaluation for Patients with Diabetes measure this calendar year.   Last documented UACR: Never  Last visit with PCP 06/30/23 with documented follow up plan: Return in about 3 months (around 02/27/2024) for Follow up.  No follow up had been scheduled. Due for return visit. If scheduled, consider UACR to address metric as above.   Cc'd to scheduling pool and fyi to PCP

## 2024-03-16 ENCOUNTER — Other Ambulatory Visit: Payer: Self-pay

## 2024-03-16 DIAGNOSIS — Z794 Long term (current) use of insulin: Secondary | ICD-10-CM

## 2024-03-16 MED ORDER — INSULIN ASPART 100 UNIT/ML FLEXPEN: 0.0000 [IU] | PEN_INJECTOR | Freq: Three times a day (TID) | SUBCUTANEOUS | 3 refills | Status: AC

## 2024-03-17 ENCOUNTER — Ambulatory Visit
Admission: RE | Admit: 2024-03-17 | Discharge: 2024-03-17 | Disposition: A | Discharge status: Home / Self Care | Source: Ambulatory Visit | Attending: Family Medicine | Admitting: Family Medicine

## 2024-03-17 DIAGNOSIS — M5416 Radiculopathy, lumbar region: Secondary | ICD-10-CM

## 2024-03-17 DIAGNOSIS — M5127 Other intervertebral disc displacement, lumbosacral region: Secondary | ICD-10-CM | POA: Diagnosis not present

## 2024-04-08 DIAGNOSIS — M47816 Spondylosis without myelopathy or radiculopathy, lumbar region: Secondary | ICD-10-CM | POA: Diagnosis not present

## 2024-04-08 DIAGNOSIS — M48062 Spinal stenosis, lumbar region with neurogenic claudication: Secondary | ICD-10-CM | POA: Diagnosis not present

## 2024-04-08 DIAGNOSIS — M5416 Radiculopathy, lumbar region: Secondary | ICD-10-CM | POA: Diagnosis not present

## 2024-04-14 ENCOUNTER — Ambulatory Visit: Admitting: Nurse Practitioner

## 2024-04-16 DIAGNOSIS — Z419 Encounter for procedure for purposes other than remedying health state, unspecified: Secondary | ICD-10-CM | POA: Diagnosis not present

## 2024-05-10 ENCOUNTER — Ambulatory Visit: Admitting: Internal Medicine

## 2024-05-10 DIAGNOSIS — Z794 Long term (current) use of insulin: Secondary | ICD-10-CM

## 2024-05-13 ENCOUNTER — Other Ambulatory Visit: Payer: Self-pay | Admitting: Nurse Practitioner

## 2024-05-13 DIAGNOSIS — E119 Type 2 diabetes mellitus without complications: Secondary | ICD-10-CM

## 2024-06-25 ENCOUNTER — Ambulatory Visit: Admitting: Nurse Practitioner
